# Patient Record
Sex: Male | Born: 1945
Health system: Southern US, Community
[De-identification: ages and names within clinical notes are randomized; demographics above are authoritative.]

## PROBLEM LIST (undated history)

## (undated) ENCOUNTER — Emergency Department (HOSPITAL_COMMUNITY): Discharge status: Absent without leave | Payer: Medicare Other

## (undated) DIAGNOSIS — K2971 Gastritis, unspecified, with bleeding: Secondary | ICD-10-CM

## (undated) DIAGNOSIS — E538 Deficiency of other specified B group vitamins: Secondary | ICD-10-CM

## (undated) DIAGNOSIS — M545 Low back pain, unspecified: Secondary | ICD-10-CM

## (undated) DIAGNOSIS — E785 Hyperlipidemia, unspecified: Secondary | ICD-10-CM

## (undated) DIAGNOSIS — G4700 Insomnia, unspecified: Secondary | ICD-10-CM

## (undated) DIAGNOSIS — I739 Peripheral vascular disease, unspecified: Secondary | ICD-10-CM

## (undated) DIAGNOSIS — Z87442 Personal history of urinary calculi: Secondary | ICD-10-CM

## (undated) DIAGNOSIS — Z9889 Other specified postprocedural states: Secondary | ICD-10-CM

## (undated) DIAGNOSIS — K219 Gastro-esophageal reflux disease without esophagitis: Secondary | ICD-10-CM

## (undated) DIAGNOSIS — H9190 Unspecified hearing loss, unspecified ear: Secondary | ICD-10-CM

## (undated) DIAGNOSIS — N200 Calculus of kidney: Secondary | ICD-10-CM

## (undated) DIAGNOSIS — R0600 Dyspnea, unspecified: Secondary | ICD-10-CM

## (undated) DIAGNOSIS — E559 Vitamin D deficiency, unspecified: Secondary | ICD-10-CM

## (undated) DIAGNOSIS — I10 Essential (primary) hypertension: Secondary | ICD-10-CM

## (undated) DIAGNOSIS — N529 Male erectile dysfunction, unspecified: Secondary | ICD-10-CM

## (undated) DIAGNOSIS — C801 Malignant (primary) neoplasm, unspecified: Secondary | ICD-10-CM

## (undated) DIAGNOSIS — I714 Abdominal aortic aneurysm, without rupture, unspecified: Secondary | ICD-10-CM

## (undated) DIAGNOSIS — M1990 Unspecified osteoarthritis, unspecified site: Secondary | ICD-10-CM

## (undated) DIAGNOSIS — T7840XA Allergy, unspecified, initial encounter: Secondary | ICD-10-CM

## (undated) DIAGNOSIS — R0609 Other forms of dyspnea: Secondary | ICD-10-CM

## (undated) HISTORY — DX: Allergy, unspecified, initial encounter: T78.40XA

## (undated) HISTORY — DX: Peripheral vascular disease, unspecified: I73.9

## (undated) HISTORY — DX: Vitamin D deficiency, unspecified: E55.9

## (undated) HISTORY — DX: Other specified postprocedural states: Z98.890

## (undated) HISTORY — DX: Male erectile dysfunction, unspecified: N52.9

## (undated) HISTORY — DX: Gastritis, unspecified, with bleeding: K29.71

## (undated) HISTORY — DX: Abdominal aortic aneurysm, without rupture, unspecified: I71.40

## (undated) HISTORY — DX: Hyperlipidemia, unspecified: E78.5

## (undated) HISTORY — DX: Essential (primary) hypertension: I10

## (undated) HISTORY — PX: COLONOSCOPY: SHX174

## (undated) HISTORY — DX: Unspecified osteoarthritis, unspecified site: M19.90

## (undated) HISTORY — DX: Gastro-esophageal reflux disease without esophagitis: K21.9

## (undated) HISTORY — DX: Deficiency of other specified B group vitamins: E53.8

## (undated) HISTORY — PX: KNEE SURGERY: SHX244

## (undated) HISTORY — DX: Insomnia, unspecified: G47.00

## (undated) HISTORY — DX: Calculus of kidney: N20.0

## (undated) HISTORY — DX: Abdominal aortic aneurysm, without rupture: I71.4

## (undated) HISTORY — DX: Low back pain: M54.5

## (undated) HISTORY — PX: TONSILLECTOMY: SUR1361

## (undated) HISTORY — DX: Unspecified hearing loss, unspecified ear: H91.90

## (undated) HISTORY — DX: Dyspnea, unspecified: R06.00

## (undated) HISTORY — DX: Other forms of dyspnea: R06.09

## (undated) HISTORY — DX: Low back pain, unspecified: M54.50

---

## 1999-04-17 ENCOUNTER — Encounter: Payer: Self-pay | Admitting: Internal Medicine

## 1999-04-17 ENCOUNTER — Encounter: Admission: RE | Admit: 1999-04-17 | Discharge: 1999-04-17 | Payer: Self-pay | Admitting: Internal Medicine

## 2001-10-04 ENCOUNTER — Ambulatory Visit (HOSPITAL_COMMUNITY): Admission: RE | Admit: 2001-10-04 | Discharge: 2001-10-04 | Payer: Self-pay | Admitting: Internal Medicine

## 2003-08-02 ENCOUNTER — Encounter: Admission: RE | Admit: 2003-08-02 | Discharge: 2003-08-02 | Payer: Self-pay | Admitting: Internal Medicine

## 2005-05-02 ENCOUNTER — Encounter: Admission: RE | Admit: 2005-05-02 | Discharge: 2005-05-02 | Payer: Self-pay | Admitting: Internal Medicine

## 2005-05-17 DIAGNOSIS — Z9889 Other specified postprocedural states: Secondary | ICD-10-CM

## 2005-05-17 HISTORY — DX: Other specified postprocedural states: Z98.890

## 2006-01-14 ENCOUNTER — Encounter: Admission: RE | Admit: 2006-01-14 | Discharge: 2006-01-14 | Payer: Self-pay | Admitting: Internal Medicine

## 2007-01-11 ENCOUNTER — Encounter: Admission: RE | Admit: 2007-01-11 | Discharge: 2007-01-11 | Payer: Self-pay | Admitting: Otolaryngology

## 2007-05-12 ENCOUNTER — Encounter: Admission: RE | Admit: 2007-05-12 | Discharge: 2007-05-12 | Payer: Self-pay | Admitting: Otolaryngology

## 2007-11-25 ENCOUNTER — Observation Stay (HOSPITAL_COMMUNITY): Admission: EM | Admit: 2007-11-25 | Discharge: 2007-11-26 | Payer: Self-pay | Admitting: Internal Medicine

## 2007-11-25 ENCOUNTER — Encounter: Payer: Self-pay | Admitting: Emergency Medicine

## 2008-07-25 ENCOUNTER — Ambulatory Visit (HOSPITAL_COMMUNITY): Admission: RE | Admit: 2008-07-25 | Discharge: 2008-07-25 | Payer: Self-pay | Admitting: Internal Medicine

## 2009-11-26 ENCOUNTER — Encounter: Admission: RE | Admit: 2009-11-26 | Discharge: 2009-11-26 | Payer: Self-pay | Admitting: Family Medicine

## 2010-07-28 ENCOUNTER — Other Ambulatory Visit: Payer: Self-pay | Admitting: Internal Medicine

## 2010-07-28 DIAGNOSIS — M5416 Radiculopathy, lumbar region: Secondary | ICD-10-CM

## 2010-07-30 ENCOUNTER — Ambulatory Visit
Admission: RE | Admit: 2010-07-30 | Discharge: 2010-07-30 | Disposition: A | Discharge status: Home / Self Care | Payer: 59 | Source: Ambulatory Visit | Attending: Internal Medicine | Admitting: Internal Medicine

## 2010-08-01 ENCOUNTER — Ambulatory Visit
Admission: RE | Admit: 2010-08-01 | Discharge: 2010-08-01 | Disposition: A | Discharge status: Home / Self Care | Payer: 59 | Source: Ambulatory Visit | Attending: Internal Medicine | Admitting: Internal Medicine

## 2010-08-01 DIAGNOSIS — M5416 Radiculopathy, lumbar region: Secondary | ICD-10-CM

## 2010-08-16 DIAGNOSIS — N200 Calculus of kidney: Secondary | ICD-10-CM

## 2010-08-16 HISTORY — DX: Calculus of kidney: N20.0

## 2010-09-29 NOTE — Discharge Summary (Signed)
NAME:  William Warner, William Warner             ACCOUNT NO.:  [id number]   MEDICAL RECORD NO.:  [id number]          PATIENT TYPE:  INP   LOCATION:  3740                         FACILITY:  [facility]   PHYSICIAN:  Thereasa Solo. Little, M.D. DATE OF BIRTH:  1945-11-03   DATE OF ADMISSION:  11/25/2007  DATE OF DISCHARGE:  11/26/2007                               DISCHARGE SUMMARY   DISCHARGE DIAGNOSES:  1. Chest pain, myocardial infarction ruled out.  2. Negative Myoview in 2004.  3. Daily ethanol.  4. History of smoking.  5. Dyslipidemia.  6. Treated hypertension.   HOSPITAL COURSE:  The patient is a 65 year old male who has had a  previous negative Myoview by Dr. Clarene Duke who presented on November 25, 2007  with chest pain.  He ruled out for an MI.  His EKG did have some  nonspecific changes with poor anterior R-wave progression.  The patient  was seen by Dr. Dossie Arbour.  He feels the patient can be discharged on  November 26, 2007 and follow up with Dr. Clarene Duke.  We will set him up for a  outpatient Myoview.  We did give him a prescription for nitroglycerin  and asked him to double up on his PPI for a week.   DISCHARGE MEDICATIONS:  1. Omeprazole 20 mg twice a day.  2. Atenolol 25 mg twice a day.  3. Lopid 600 mg twice a day.  4. Felodipine 5 mg a day.  5. Simvastatin 20 mg a day.  6. Benazepril 10 mg a day.  7. Aspirin 81 mg a day.  8. Nitroglycerin sublingual p.r.n.   LABORATORY DATA:  White count 6.2, hemoglobin 14.2, hematocrit 41.2,  platelets 112.  Sodium 138, potassium 3.2, BUN 5, creatinine 0.8.  CK-MB  and troponins were negative x3.  Cholesterol was 153, HDL 52, LDL 62.  TSH 3.95.  D-dimer was 1.  A CT was obtained which showed no pulmonary  embolism.  Chest x-ray with borderline cardiomegaly, no acute process.  EKG shows sinus rhythm with poor anterior R-wave progression and left  axis deviation.   DISPOSITION:  The patient was discharged in stable condition.  He will  have an outpatient  Myoview and then see Dr. Clarene Duke in followup.      Abelino Derrick, P.A.    ______________________________  Thereasa Solo. Little, M.D.    [name]  D:  11/26/2007  T:  11/26/2007  Job:  [id number]   cc:   Candyce Churn, M.D.

## 2011-02-11 LAB — CBC
HCT: 41.2
HCT: 44.5
Hemoglobin: 15.7
MCHC: 34.5
MCV: 94.7
MCV: 93.9
Platelets: 112 — ABNORMAL LOW
Platelets: 119 — ABNORMAL LOW
RBC: 4.74
RDW: 13.5
RDW: 13.1
WBC: 7.5

## 2011-02-11 LAB — DIFFERENTIAL
Eosinophils Absolute: 0.2
Eosinophils Relative: 2
Lymphocytes Relative: 23
Lymphs Abs: 1.4
Monocytes Absolute: 0.6
Monocytes Absolute: 0.5
Monocytes Relative: 9
Monocytes Relative: 7
Neutro Abs: 4
Neutrophils Relative %: 65
Neutrophils Relative %: 69

## 2011-02-11 LAB — POCT CARDIAC MARKERS: Operator id: 5507

## 2011-02-11 LAB — LIPID PANEL
LDL Cholesterol: 62
Total CHOL/HDL Ratio: 2.9
Triglycerides: 194 — ABNORMAL HIGH

## 2011-02-11 LAB — D-DIMER, QUANTITATIVE: D-Dimer, Quant: 1.04 — ABNORMAL HIGH

## 2011-02-11 LAB — TSH: TSH: 3.958

## 2011-02-11 LAB — HEPARIN LEVEL (UNFRACTIONATED): Heparin Unfractionated: 1 — ABNORMAL HIGH

## 2011-02-11 LAB — MAGNESIUM: Magnesium: 1.8

## 2011-02-11 LAB — PROTIME-INR: Prothrombin Time: 12.9

## 2011-02-11 LAB — BASIC METABOLIC PANEL
CO2: 21
CO2: 22
Calcium: 8.8
Calcium: 9.5
Creatinine, Ser: 0.9
GFR calc Af Amer: >60
Glucose, Bld: 129 — ABNORMAL HIGH
Sodium: 140

## 2011-02-11 LAB — CARDIAC PANEL(CRET KIN+CKTOT+MB+TROPI)
CK, MB: 2.1
CK, MB: 2.3
Relative Index: 2.2
Relative Index: INVALID
Troponin I: <0.01
Troponin I: <0.01

## 2011-07-13 ENCOUNTER — Other Ambulatory Visit: Payer: Self-pay | Admitting: Internal Medicine

## 2011-07-13 DIAGNOSIS — R1031 Right lower quadrant pain: Secondary | ICD-10-CM

## 2011-07-16 ENCOUNTER — Ambulatory Visit
Admission: RE | Admit: 2011-07-16 | Discharge: 2011-07-16 | Disposition: A | Discharge status: Home / Self Care | Payer: Medicare Other | Source: Ambulatory Visit | Attending: Internal Medicine | Admitting: Internal Medicine

## 2011-07-16 DIAGNOSIS — R1031 Right lower quadrant pain: Secondary | ICD-10-CM

## 2011-07-16 MED ORDER — IOHEXOL 300 MG/ML  SOLN
100.0000 mL | Freq: Once | INTRAMUSCULAR | Status: AC | PRN
  Administered 2011-07-16: 100 mL via INTRAVENOUS

## 2011-07-20 ENCOUNTER — Encounter: Payer: Self-pay | Admitting: Internal Medicine

## 2011-12-14 ENCOUNTER — Encounter (INDEPENDENT_AMBULATORY_CARE_PROVIDER_SITE_OTHER): Payer: Self-pay | Admitting: Surgery

## 2012-01-06 ENCOUNTER — Ambulatory Visit (INDEPENDENT_AMBULATORY_CARE_PROVIDER_SITE_OTHER): Payer: Medicare Other | Admitting: Surgery

## 2012-01-06 ENCOUNTER — Encounter (INDEPENDENT_AMBULATORY_CARE_PROVIDER_SITE_OTHER): Payer: Self-pay | Admitting: Surgery

## 2012-01-06 VITALS — BP 120/86 | HR 68 | Temp 98.1°F | Resp 20 | Ht 70.0 in | Wt 189.0 lb

## 2012-01-06 DIAGNOSIS — R1011 Right upper quadrant pain: Secondary | ICD-10-CM

## 2012-01-06 NOTE — Progress Notes (Signed)
Patient ID: William Warner, male   DOB: November 18, 1945, 66 y.o.   MRN: [id number]  Chief Complaint  Patient presents with  . Pre-op Exam    ing hernia    HPI William Warner is a 66 y.o. male.  Referred by Dr. Johnella Warner for right upper quadrant pain, right inguinal hernia HPI This is a 66 year old male who presents with at least 3 years of intermittent right upper quadrant abdominal pain. The patient cannot pinpoint any exacerbating event. He does not relate this pain to food. The pain does radiate around to his back. He does also have some chronic back pain with some disc disease followed by Dr. Marikay Warner. He has had workup including an abdominal ultrasound which was unremarkable. A CT scan showed no reason for his pain but did show a small right inguinal hernia as well as a hydrocele. The patient denies any pain in his right groin. He was also noted to have some mild diverticulosis of the colon but no sign of diverticulitis. He also has a small abdominal aortic aneurysm. He is now referred for evaluation of his hernia as well as his right upper quadrant abdominal pain. Past Medical History  Diagnosis Date  . Hypertension   . Hyperlipidemia   . GERD (gastroesophageal reflux disease)   . Hemorrhagic gastritis   . Insomnia   . Peripheral vascular disease   . Renal calculi   . DJD (degenerative joint disease)   . Allergy   . Hx of colonoscopy 2007  . Erectile dysfunction   . Hearing loss   . Other B-complex deficiencies   . Lumbago   AAA - 3.1 cm   PSH:  Tonsillectomy, Right knee surgery   History reviewed. No pertinent family history.  Social History History  Substance Use Topics  . Smoking status: Current Everyday Smoker -- 1.0 packs/day    Types: Cigarettes  . Smokeless tobacco: Not on file  . Alcohol Use: 0.0 oz/week    3-6 Cans of beer per week    Allergies  Allergen Reactions  . Bee Venom Swelling    Current Outpatient Prescriptions  Medication Sig  Dispense Refill  . aspirin 81 MG tablet Take 81 mg by mouth daily.      William Warner atenolol (TENORMIN) 25 MG tablet Take 25 mg by mouth daily.      . clonazePAM (KLONOPIN) 2 MG tablet Take 2 mg by mouth 2 (two) times daily as needed.      William Warner EPINEPHrine (EPIPEN JR) 0.15 MG/0.3ML injection Inject 0.15 mg into the muscle as needed.      William Warner esomeprazole (NEXIUM) 40 MG capsule Take 40 mg by mouth daily before breakfast.      . felodipine (PLENDIL) 5 MG 24 hr tablet Take 5 mg by mouth daily.      William Warner gemfibrozil (LOPID) 600 MG tablet Take 600 mg by mouth 2 (two) times daily before a meal.      . simvastatin (ZOCOR) 20 MG tablet Take 20 mg by mouth every evening.      . traMADol (ULTRAM) 50 MG tablet Take 50 mg by mouth every 6 (six) hours as needed.        Review of Systems Review of Systems  Constitutional: Negative for fever, chills and unexpected weight change.  HENT: Negative for hearing loss, congestion, sore throat, trouble swallowing and voice change.   Eyes: Negative for visual disturbance.  Respiratory: Negative for cough and wheezing.   Cardiovascular: Negative for chest  pain, palpitations and leg swelling.  Gastrointestinal: Positive for abdominal pain and diarrhea. Negative for nausea, vomiting, constipation, blood in stool, abdominal distention, anal bleeding and rectal pain.  Genitourinary: Negative for hematuria and difficulty urinating.  Musculoskeletal: Positive for back pain and arthralgias.  Skin: Negative for rash and wound.  Neurological: Negative for seizures, syncope, weakness and headaches.  Hematological: Negative for adenopathy. Does not bruise/bleed easily.  Psychiatric/Behavioral: Negative for confusion.    Blood pressure 120/86, pulse 68, temperature 98.1 F (36.7 C), temperature source Temporal, resp. rate 20, height 5\' 10"  (1.778 m), weight 189 lb (85.73 kg).  Physical Exam Physical Exam WDWN in NAD HEENT:  EOMI, sclera anicteric Neck:  No masses, no  thyromegaly Lungs:  CTA bilaterally; normal respiratory effort CV:  Regular rate and rhythm; no murmurs Abd:  +bowel sounds, soft, non-tender, no masses GU:  No palpable hernia on either side; no tenderness; bilateral descended testicles - no masses Ext:  Well-perfused; no edema Skin:  Warm, dry; no sign of jaundice  Data Reviewed  07/28/10 *RADIOLOGY REPORT*  Clinical Data: Abdominal pain.  COMPLETE ABDOMINAL ULTRASOUND  Comparison: CT scan 11/26/2009 and abdominal ultrasound  07/25/2008.  Findings:  Gallbladder: No gallstones, gallbladder wall thickening, or  pericholecystic fluid.  Common bile duct: Normal in caliber measuring a maximum of 3.2 mm.  Liver: Diffuse increased echogenicity with poor through  transmission and poor definition of the liver architecture  suggesting fatty infiltration. No focal hepatic lesions or  intrahepatic ductal dilatation.  IVC: Normal caliber.  Pancreas: Limited visualization due to overlying bowel gas.  Spleen: Normal in size and echogenicity without focal lesions.  Right Kidney: 10.6 cm in length. Normal renal cortical thickness  and echogenicity without focal lesions or hydronephrosis.A 6 mm  lower pole calculus is suspected.  Left Kidney: 12.4 cm in length. Normal renal cortical thickness  and echogenicity without focal lesions or hydronephrosis.A 6 mm  lower pole renal calculus is suspected.  Abdominal aorta: Atherosclerotic changes. Small focal  infrarenal aneurysm with maximal AP diameter of 2.4 cm, maximal  transverse diameter of 2.7 cm and length of 2.9 cm.  IMPRESSION:  1. Normal sonographic appearance of the gallbladder and normal  caliber common bile duct.  2. Diffuse fatty infiltration of the liver.  3. Limited visualization of the pancreas.  4. Bilateral lower pole renal calculi.  5. Small focal infrarenal abdominal aortic aneurysm.  Original Report Authenticated By: William Warner, M.D.  07/13/11 *RADIOLOGY REPORT*   Clinical Data: Right lower quadrant abdominal pain.  CT ABDOMEN AND PELVIS WITH CONTRAST  Technique: Multidetector CT imaging of the abdomen and pelvis was  performed following the standard protocol during bolus  administration of intravenous contrast.  Contrast: OMNIPAQUE IOHEXOL 300 MG/ML IJ SOLN  Comparison: CT scan 11/26/2009.  Findings: The examination is somewhat limited by patient motion.  The visualized lung bases are clear.  The liver is unremarkable. No focal lesions or biliary dilatation.  The gallbladder is normal. No common bile duct dilatation. The  pancreas is normal. The spleen is normal in size. No focal  lesions. The adrenal glands and kidneys are grossly normal. A  small interpolar right renal calculus is noted. No obstructing  ureteral calculi or bladder calculi.  The stomach, duodenum, small bowel and colon are unremarkable  except for moderate diverticulosis of the sigmoid colon. No  findings for acute diverticulitis. The appendix is normal.  There are advanced atherosclerotic changes involving the aorta but  no aneurysm. Stable small  focal chronic dissection distally versus  ulcerated plaque. No mesenteric or retroperitoneal mass or  adenopathy.  The bladder, prostate gland and seminal vesicles are unremarkable.  No inguinal mass or adenopathy. A right-sided inguinal hernia and  hydrocele is noted.  The bony structures are intact. There are bilateral pars defects  noted at L5 with a grade 1 spondylolisthesis. Moderate  degenerative changes in the lumbar spine. A hemangioma is noted in  the L3 vertebral body.  IMPRESSION:  1. No acute abdominal/pelvic findings, mass lesions or adenopathy.  2. Advanced atherosclerotic changes involving the aorta and iliac  vessels.  3. Diverticulosis of the sigmoid colon but no findings for acute  diverticulitis.  4. Right inguinal hernia and hydrocele.  Original Report Authenticated By: William Warner,  M.D.    Assessment    RUQ pain - intermittent, unclear etiology; negative ultrasound; no other findings on CT scan Right inguinal hernia - asymptomatic    Plan    HIDA scan to rule out biliary dyskinesia Will watch the asymptomatic RIH for now.  If it enlarges or becomes asymptomatic, we will consider elective repair. Follow-up after HIDA scan       Darius Lundberg K. 01/06/2012, 5:37 PM

## 2012-01-12 ENCOUNTER — Encounter (HOSPITAL_COMMUNITY)
Admission: RE | Admit: 2012-01-12 | Discharge: 2012-01-12 | Disposition: A | Discharge status: Home / Self Care | Payer: Medicare Other | Source: Ambulatory Visit | Attending: Surgery | Admitting: Surgery

## 2012-01-12 DIAGNOSIS — R1011 Right upper quadrant pain: Secondary | ICD-10-CM | POA: Insufficient documentation

## 2012-01-12 MED ORDER — TECHNETIUM TC 99M MEBROFENIN IV KIT
5.0000 | PACK | Freq: Once | INTRAVENOUS | Status: AC | PRN
  Administered 2012-01-12: 5 via INTRAVENOUS

## 2012-01-27 ENCOUNTER — Encounter (INDEPENDENT_AMBULATORY_CARE_PROVIDER_SITE_OTHER): Payer: Self-pay | Admitting: Surgery

## 2012-01-27 ENCOUNTER — Ambulatory Visit (INDEPENDENT_AMBULATORY_CARE_PROVIDER_SITE_OTHER): Payer: Medicare Other | Admitting: Surgery

## 2012-01-27 VITALS — BP 124/76 | HR 71 | Temp 98.0°F | Resp 16 | Ht 70.0 in | Wt 193.2 lb

## 2012-01-27 DIAGNOSIS — R1011 Right upper quadrant pain: Secondary | ICD-10-CM

## 2012-01-27 NOTE — Patient Instructions (Signed)
Call my nurse Pattricia Boss at [phone] to schedule a follow-up appointment if needed.

## 2012-01-27 NOTE — Progress Notes (Signed)
The patient continues to have some intermittent right-sided abdominal pain his high the skin was negative with a gallbladder ejection fraction over 76%. On examination I cannot palpate any abdominal masses. I cannot palpate the small right inguinal hernia that was seen on the CT scan. He has a annual physical exam with Dr. Gayleen Orem next week. I would recommend considering referral for a colonoscopy. His last colonoscopy was about 8 years ago. If the colonoscopy is negative then it is likely that this chronic low-grade pain is from a spinal etiology. I gave him our number to call for a PRN followup visit.  Filed Vitals:   01/27/12 1434  BP: 124/76  Pulse: 71  Temp: 98 F (36.7 C)  Resp: 16    Rhiann Boucher K. Corliss Skains, MD, [facility]  01/27/2012 3:03 PM

## 2013-03-27 ENCOUNTER — Other Ambulatory Visit: Payer: Self-pay | Admitting: Internal Medicine

## 2013-03-27 DIAGNOSIS — R109 Unspecified abdominal pain: Secondary | ICD-10-CM

## 2013-03-29 ENCOUNTER — Ambulatory Visit
Admission: RE | Admit: 2013-03-29 | Discharge: 2013-03-29 | Disposition: A | Discharge status: Home / Self Care | Payer: Medicare Other | Source: Ambulatory Visit | Attending: Internal Medicine | Admitting: Internal Medicine

## 2013-03-29 DIAGNOSIS — R109 Unspecified abdominal pain: Secondary | ICD-10-CM

## 2013-03-29 MED ORDER — IOHEXOL 300 MG/ML  SOLN
100.0000 mL | Freq: Once | INTRAMUSCULAR | Status: AC | PRN
  Administered 2013-03-29: 100 mL via INTRAVENOUS

## 2014-03-18 ENCOUNTER — Other Ambulatory Visit: Payer: Self-pay | Admitting: Internal Medicine

## 2014-03-18 ENCOUNTER — Ambulatory Visit
Admission: RE | Admit: 2014-03-18 | Discharge: 2014-03-18 | Disposition: A | Discharge status: Home / Self Care | Payer: Medicare Other | Source: Ambulatory Visit | Attending: Internal Medicine | Admitting: Internal Medicine

## 2014-03-18 DIAGNOSIS — R634 Abnormal weight loss: Secondary | ICD-10-CM

## 2014-06-25 ENCOUNTER — Emergency Department (HOSPITAL_BASED_OUTPATIENT_CLINIC_OR_DEPARTMENT_OTHER): Payer: PPO

## 2014-06-25 ENCOUNTER — Emergency Department (HOSPITAL_BASED_OUTPATIENT_CLINIC_OR_DEPARTMENT_OTHER)
Admission: EM | Admit: 2014-06-25 | Discharge: 2014-06-25 | Disposition: A | Discharge status: Home / Self Care | Payer: PPO | Attending: Emergency Medicine | Admitting: Emergency Medicine

## 2014-06-25 ENCOUNTER — Encounter (HOSPITAL_BASED_OUTPATIENT_CLINIC_OR_DEPARTMENT_OTHER): Payer: Self-pay

## 2014-06-25 DIAGNOSIS — K219 Gastro-esophageal reflux disease without esophagitis: Secondary | ICD-10-CM | POA: Diagnosis not present

## 2014-06-25 DIAGNOSIS — I10 Essential (primary) hypertension: Secondary | ICD-10-CM | POA: Insufficient documentation

## 2014-06-25 DIAGNOSIS — Z79899 Other long term (current) drug therapy: Secondary | ICD-10-CM | POA: Insufficient documentation

## 2014-06-25 DIAGNOSIS — Z87442 Personal history of urinary calculi: Secondary | ICD-10-CM | POA: Diagnosis not present

## 2014-06-25 DIAGNOSIS — N201 Calculus of ureter: Secondary | ICD-10-CM | POA: Diagnosis not present

## 2014-06-25 DIAGNOSIS — E785 Hyperlipidemia, unspecified: Secondary | ICD-10-CM | POA: Insufficient documentation

## 2014-06-25 DIAGNOSIS — Z720 Tobacco use: Secondary | ICD-10-CM | POA: Diagnosis not present

## 2014-06-25 DIAGNOSIS — Z7982 Long term (current) use of aspirin: Secondary | ICD-10-CM | POA: Diagnosis not present

## 2014-06-25 DIAGNOSIS — H9190 Unspecified hearing loss, unspecified ear: Secondary | ICD-10-CM | POA: Diagnosis not present

## 2014-06-25 DIAGNOSIS — R109 Unspecified abdominal pain: Secondary | ICD-10-CM

## 2014-06-25 DIAGNOSIS — R1031 Right lower quadrant pain: Secondary | ICD-10-CM | POA: Diagnosis present

## 2014-06-25 DIAGNOSIS — N23 Unspecified renal colic: Secondary | ICD-10-CM

## 2014-06-25 LAB — CBC
HCT: 42.9 % (ref 39.0–52.0)
Hemoglobin: 14.3 g/dL (ref 13.0–17.0)
MCH: 31.2 pg (ref 26.0–34.0)
MCHC: 33.3 g/dL (ref 30.0–36.0)
MCV: 93.5 fL (ref 78.0–100.0)
Platelets: 171 10*3/uL (ref 150–400)
RBC: 4.59 MIL/uL (ref 4.22–5.81)
RDW: 12.8 % (ref 11.5–15.5)
WBC: 10.7 10*3/uL — ABNORMAL HIGH (ref 4.0–10.5)

## 2014-06-25 LAB — BASIC METABOLIC PANEL
Anion gap: 5 (ref 5–15)
BUN: 13 mg/dL (ref 6–23)
CO2: 26 mmol/L (ref 19–32)
CREATININE: 0.88 mg/dL (ref 0.50–1.35)
Calcium: 8.7 mg/dL (ref 8.4–10.5)
Chloride: 104 mmol/L (ref 96–112)
GFR calc Af Amer: >90 mL/min (ref >90)
GFR calc non Af Amer: 86 mL/min — ABNORMAL LOW (ref >90)
GLUCOSE: 112 mg/dL — AB (ref 70–99)
Potassium: 3.5 mmol/L (ref 3.5–5.1)
Sodium: 135 mmol/L (ref 135–145)

## 2014-06-25 LAB — URINALYSIS, ROUTINE W REFLEX MICROSCOPIC
Bilirubin Urine: NEGATIVE
GLUCOSE, UA: NEGATIVE mg/dL
Ketones, ur: NEGATIVE mg/dL
Nitrite: NEGATIVE
PH: 5.5 (ref 5.0–8.0)
PROTEIN: 30 mg/dL — AB
Specific Gravity, Urine: 1.023 (ref 1.005–1.030)
UROBILINOGEN UA: 1 mg/dL (ref 0.0–1.0)

## 2014-06-25 LAB — URINE MICROSCOPIC-ADD ON

## 2014-06-25 MED ORDER — HYDROMORPHONE HCL 1 MG/ML IJ SOLN
1.0000 mg | Freq: Once | INTRAMUSCULAR | Status: AC
  Administered 2014-06-25: 1 mg via INTRAVENOUS
  Filled 2014-06-25: qty 1

## 2014-06-25 MED ORDER — TAMSULOSIN HCL 0.4 MG PO CAPS: 0.4000 mg | ORAL_CAPSULE | Freq: Every day | ORAL | Status: DC

## 2014-06-25 MED ORDER — ONDANSETRON HCL 4 MG PO TABS: 4.0000 mg | ORAL_TABLET | Freq: Four times a day (QID) | ORAL | Status: DC

## 2014-06-25 MED ORDER — OXYCODONE-ACETAMINOPHEN 5-325 MG PO TABS
1.0000 | ORAL_TABLET | Freq: Four times a day (QID) | ORAL | Status: DC | PRN
Start: 2014-06-25 — End: 2017-09-17

## 2014-06-25 MED ORDER — ONDANSETRON HCL 4 MG/2ML IJ SOLN
4.0000 mg | Freq: Once | INTRAMUSCULAR | Status: AC
  Administered 2014-06-25: 4 mg via INTRAVENOUS
  Filled 2014-06-25: qty 2

## 2014-06-25 NOTE — ED Provider Notes (Signed)
CSN: [id number]     Arrival date & time 06/25/14  0410 History   First MD Initiated Contact with Patient 06/25/14 0421     Chief Complaint  Patient presents with  . Flank Pain     (Consider location/radiation/quality/duration/timing/severity/associated sxs/prior Treatment) HPI  Pt presenting with c/o right flank pain. Pt he has pain in right lower abdomen as well.  Has hx of kidney stones and states this feels similar.  No fever. He had some pain 1 day ago associated with vomiting which resolved until earlier tonight when pain recurred.  .  No changes in stools.  No dysuria or blood in urine.  Pain began 4 hours prior to arrival and has been constant.   He has not had any treatment prior to arrival.  There are no other associated systemic symptoms, there are no other alleviating or modifying factors.   Past Medical History  Diagnosis Date  . Hypertension   . Hyperlipidemia   . GERD (gastroesophageal reflux disease)   . Hemorrhagic gastritis   . Insomnia   . Peripheral vascular disease   . Renal calculi   . DJD (degenerative joint disease)   . Allergy   . Hx of colonoscopy 2007  . Erectile dysfunction   . Hearing loss   . Other B-complex deficiencies   . Lumbago    Past Surgical History  Procedure Laterality Date  . Tonsillectomy     No family history on file. History  Substance Use Topics  . Smoking status: Current Every Day Smoker -- 1.00 packs/day    Types: Cigarettes  . Smokeless tobacco: Not on file  . Alcohol Use: No    Review of Systems  ROS reviewed and all otherwise negative except for mentioned in HPI    Allergies  Bee venom  Home Medications   Prior to Admission medications   Medication Sig Start Date End Date Taking? Authorizing Provider  aspirin 81 MG tablet Take 81 mg by mouth daily.    Historical Provider, MD  atenolol (TENORMIN) 25 MG tablet Take 25 mg by mouth daily.    Historical Provider, MD  clonazePAM (KLONOPIN) 2 MG tablet Take 2 mg by  mouth 2 (two) times daily as needed.    Historical Provider, MD  EPINEPHrine (EPIPEN JR) 0.15 MG/0.3ML injection Inject 0.15 mg into the muscle as needed.    Historical Provider, MD  esomeprazole (NEXIUM) 40 MG capsule Take 40 mg by mouth daily before breakfast.    Historical Provider, MD  felodipine (PLENDIL) 5 MG 24 hr tablet Take 5 mg by mouth daily.    Historical Provider, MD  gemfibrozil (LOPID) 600 MG tablet Take 600 mg by mouth 2 (two) times daily before a meal.    Historical Provider, MD  ondansetron (ZOFRAN) 4 MG tablet Take 1 tablet (4 mg total) by mouth every 6 (six) hours. 06/25/14   Threasa Beards, MD  oxyCODONE-acetaminophen (PERCOCET/ROXICET) 5-325 MG per tablet Take 1-2 tablets by mouth every 6 (six) hours as needed for severe pain. 06/25/14   Threasa Beards, MD  simvastatin (ZOCOR) 20 MG tablet Take 20 mg by mouth every evening.    Historical Provider, MD  tamsulosin (FLOMAX) 0.4 MG CAPS capsule Take 1 capsule (0.4 mg total) by mouth daily. 06/25/14   Threasa Beards, MD  traMADol (ULTRAM) 50 MG tablet Take 50 mg by mouth every 6 (six) hours as needed.    Historical Provider, MD   BP 119/55 mmHg  Pulse 62  Temp(Src) 98.6 F (37 C) (Oral)  Resp 18  Ht 5\' 10"  (1.778 m)  Wt 165 lb (74.844 kg)  BMI 23.68 kg/m2  SpO2 98%  Vitals reviewed Physical Exam  Physical Examination: General appearance - alert, well appearing, and in no distress Mental status - alert, oriented to person, place, and time Eyes - no conjunctival injection, no scleral icterus Mouth - mucous membranes moist, pharynx normal without lesions Chest - clear to auscultation, no wheezes, rales or rhonchi, symmetric air entry Heart - normal rate, regular rhythm, normal S1, S2, no murmurs, rubs, clicks or gallops Abdomen - soft, nontender, nondistended, no masses or organomegaly, nabs Back exam - no midline tenderness to palpation, mild right CVA tenderness Extremities - peripheral pulses normal, no pedal edema, no  clubbing or cyanosis Skin - normal coloration and turgor, no rashes  ED Course  Procedures (including critical care time)  6:10 AM on recheck pain is down from 9 to 3.  CT scan shows proximal ureteral stone.  Pt requests another dose of pain medication.   Labs Review Labs Reviewed  CBC - Abnormal; Notable for the following:    WBC 10.7 (*)    All other components within normal limits  BASIC METABOLIC PANEL - Abnormal; Notable for the following:    Glucose, Bld 112 (*)    GFR calc non Af Amer 86 (*)    All other components within normal limits  URINALYSIS, ROUTINE W REFLEX MICROSCOPIC - Abnormal; Notable for the following:    APPearance CLOUDY (*)    Hgb urine dipstick LARGE (*)    Protein, ur 30 (*)    Leukocytes, UA TRACE (*)    All other components within normal limits  URINE MICROSCOPIC-ADD ON - Abnormal; Notable for the following:    Squamous Epithelial / LPF FEW (*)    All other components within normal limits  URINE CULTURE    Imaging Review Ct Renal Stone Study  06/25/2014   CLINICAL DATA:  Acute onset of right flank pain for 4 hours. Nausea and vomiting. Hematuria. Initial encounter.  EXAM: CT ABDOMEN AND PELVIS WITHOUT CONTRAST  TECHNIQUE: Multidetector CT imaging of the abdomen and pelvis was performed following the standard protocol without IV contrast.  COMPARISON:  CT of the abdomen and pelvis performed 07/16/2011  FINDINGS: Minimal left basilar atelectasis or scarring is noted. Trace pericardial fluid remains within normal limits.  The liver and spleen are unremarkable in appearance. The gallbladder is within normal limits. The pancreas and adrenal glands are unremarkable. A few calcified nodes are noted about the pancreatic head.  There is mild right-sided hydronephrosis, with right-sided perinephric stranding and fluid, and an obstructing 5 x 4 mm stone in the proximal right ureter, 5 cm below the right renal pelvis. Scattered small bilateral renal stones are noted,  measuring up to 4 mm in size. Minimal nonspecific left-sided perinephric stranding is seen.  No free fluid is identified. The small bowel is unremarkable in appearance. The stomach is within normal limits. Diffuse calcification is seen along the abdominal aorta and its branches. There is aneurysmal dilatation of the infrarenal abdominal aorta to 3.0 cm in AP dimension and 3.2 cm in transverse dimension. Diffuse vascular calcification extends distally into both lower extremities, with concern for underlying luminal narrowing.  The appendix is normal in caliber and contains air, without evidence for appendicitis. Scattered diverticulosis is noted along the proximal sigmoid colon, without evidence of diverticulitis.  The bladder is mildly distended and grossly unremarkable. The  prostate is normal in size, with scattered calcification. No inguinal lymphadenopathy is seen.  No acute osseous abnormalities are identified. Chronic bilateral pars defects are seen at L5, with grade 1 anterolisthesis of L5 on S1, and underlying degenerative change. Endplate sclerotic change is noted at L2-L3, with associated disc space narrowing. There is mild grade 1 retrolisthesis of L3 on L4.  IMPRESSION: 1. Mild right-sided hydronephrosis, with an obstructing 5 x 4 mm stone in the proximal right ureter, 5 cm below the right renal pelvis. 2. Aneurysmal dilatation of the infrarenal abdominal aorta to 3.0 cm in AP dimension and 3.2 cm in transverse dimension. This is mildly increased from 2014. Recommend followup by ultrasound in 3 years. This recommendation follows ACR consensus guidelines: White Paper of the ACR Incidental Findings Committee II on Vascular Findings. J Am Coll Radiol 2013; 09:407-680 3. Diffuse calcification along the abdominal aorta and its branches. Diffuse vascular calcification extends distally into both lower extremities, with suspected underlying luminal narrowing. 4. Scattered diverticulosis along the proximal sigmoid  colon, without evidence of diverticulitis. 5. Scattered small nonobstructing bilateral renal stones, measuring up to 4 mm in size. 6. Endplate sclerotic change at L2-L3; chronic bilateral pars defects at L5, with grade 1 anterolisthesis of L5 on S1, and underlying degenerative change.   Electronically Signed   By: Garald Balding M.D.   On: 06/25/2014 06:03     EKG Interpretation None      MDM   Final diagnoses:  Right ureteral stone  Ureteral colic    Pt with right flank pain, RBCs in urine.  Renal function is normal.  He has proximal ureteral stone on right.  Pain under control after analgesia in the ED.  Urine culture sent.  Pt advised to f/u with urology.  Findings of aortic dilatation relayed to patient as well as need for follwup ultrasound in 3 years.  Discharged with strict return precautions.  Pt agreeable with plan.  Pt presenting  Threasa Beards, MD 06/25/14 [phone]

## 2014-06-25 NOTE — Discharge Instructions (Signed)
Return to the ED with any concerns including fever/chills, vomiting and not able to keep down liquids, pain not controlled by pain medication, difficulty urinating, decreased level of alertness/lethargy, or any other alarming symptoms   There was an incidental finding on the CT scan- the abdominal aorta should be followed up with another ultrasound to check its size in 3 years- see below, per radiology  Aneurysmal dilatation of the infrarenal abdominal aorta to 3.0 cm in AP dimension and 3.2 cm in transverse dimension. This is mildly increased from 2014. Recommend followup by ultrasound in 3 years. This recommendation follows ACR consensus guidelines: White Paper of the ACR Incidental Findings Committee II on Vascular Findings. Natasha Mead Coll Radiol 2013; [phone]

## 2014-06-25 NOTE — ED Notes (Signed)
Pt c/o rt flank pain radiating down to rt groin area x4hrs, states had vomiting and diarrhea on Sunday; pt c/o hx of kidney stones and feels the same

## 2014-06-27 LAB — URINE CULTURE

## 2014-06-28 ENCOUNTER — Telehealth (HOSPITAL_COMMUNITY): Payer: Self-pay

## 2014-06-28 NOTE — Telephone Encounter (Signed)
Post ED Visit - Positive Culture Follow-up  Culture report reviewed by antimicrobial stewardship pharmacist: []  Wes [place], Pharm.D., BCPS []  Heide Guile, Pharm.D., BCPS [x]  Alycia Rossetti, Pharm.D., BCPS []  [place], Pharm.D., BCPS, AAHIVP []  Legrand Como, Pharm.D., BCPS, AAHIVP []  Isac Sarna, Pharm.D., BCPS  Positive Urine culture, 75,000 colonies -> E Coli Chart reviewed by K. [place] PA, F/U as scheduled.  No further patient follow-up is required at this time.  Dortha Kern 06/28/2014, 11:20 AM

## 2014-06-28 NOTE — Progress Notes (Signed)
ED Antimicrobial Stewardship Positive Culture Follow Up   William Warner is an 69 y.o. male who presented to [facility] on 06/25/2014 with a chief complaint of flank pain  Chief Complaint  Patient presents with  . Flank Pain    Recent Results (from the past 720 hour(s))  Urine culture     Status: None   Collection Time: 06/25/14  4:46 AM  Result Value Ref Range Status   Specimen Description URINE, CATHETERIZED  Final   Special Requests NONE  Final   Colony Count   Final    75,000 COLONIES/ML Performed at [facility]    Culture   Final    ESCHERICHIA COLI Performed at [facility]    Report Status 06/27/2014 FINAL  Final   Organism ID, Bacteria ESCHERICHIA COLI  Final      Susceptibility   Escherichia coli - MIC*    AMPICILLIN <=2 SENSITIVE Sensitive     CEFAZOLIN <=4 SENSITIVE Sensitive     CEFTRIAXONE <=1 SENSITIVE Sensitive     CIPROFLOXACIN <=0.25 SENSITIVE Sensitive     GENTAMICIN <=1 SENSITIVE Sensitive     LEVOFLOXACIN <=0.12 SENSITIVE Sensitive     NITROFURANTOIN <=16 SENSITIVE Sensitive     TOBRAMYCIN <=1 SENSITIVE Sensitive     TRIMETH/SULFA <=20 SENSITIVE Sensitive     PIP/TAZO <=4 SENSITIVE Sensitive     * ESCHERICHIA COLI    [x]  No treatment needed  78 YOM who presented with flank pain similar to prior kidney stones. Imaging revealed a R-proximal ureteral stone. UA was unimpressive of infection, the patient was afebrile, WBC wnl. Only 75k of E.coli grew out in urine culture. Discussed with PA, would not recommend treating for now, patient to follow-up with urology.  New antibiotic prescription: No treatment needed  ED Provider: Quincy Carnes, PA-C  Lawson Radar 06/28/2014, 9:35 AM Infectious Diseases Pharmacist Phone# [phone]

## 2015-07-08 DIAGNOSIS — D81818 Other biotin-dependent carboxylase deficiency: Secondary | ICD-10-CM | POA: Diagnosis not present

## 2015-07-08 DIAGNOSIS — Z1389 Encounter for screening for other disorder: Secondary | ICD-10-CM | POA: Diagnosis not present

## 2015-07-08 DIAGNOSIS — E782 Mixed hyperlipidemia: Secondary | ICD-10-CM | POA: Diagnosis not present

## 2015-07-08 DIAGNOSIS — Z720 Tobacco use: Secondary | ICD-10-CM | POA: Diagnosis not present

## 2015-07-08 DIAGNOSIS — Z0001 Encounter for general adult medical examination with abnormal findings: Secondary | ICD-10-CM | POA: Diagnosis not present

## 2015-07-08 DIAGNOSIS — M7582 Other shoulder lesions, left shoulder: Secondary | ICD-10-CM | POA: Diagnosis not present

## 2015-07-08 DIAGNOSIS — Z23 Encounter for immunization: Secondary | ICD-10-CM | POA: Diagnosis not present

## 2015-07-08 DIAGNOSIS — I10 Essential (primary) hypertension: Secondary | ICD-10-CM | POA: Diagnosis not present

## 2015-07-08 DIAGNOSIS — N3943 Post-void dribbling: Secondary | ICD-10-CM | POA: Diagnosis not present

## 2015-07-08 DIAGNOSIS — Z125 Encounter for screening for malignant neoplasm of prostate: Secondary | ICD-10-CM | POA: Diagnosis not present

## 2015-07-08 DIAGNOSIS — G4700 Insomnia, unspecified: Secondary | ICD-10-CM | POA: Diagnosis not present

## 2015-07-08 DIAGNOSIS — Z79899 Other long term (current) drug therapy: Secondary | ICD-10-CM | POA: Diagnosis not present

## 2015-07-08 DIAGNOSIS — R634 Abnormal weight loss: Secondary | ICD-10-CM | POA: Diagnosis not present

## 2015-08-06 ENCOUNTER — Telehealth: Payer: Self-pay | Admitting: Acute Care

## 2015-08-06 DIAGNOSIS — F17210 Nicotine dependence, cigarettes, uncomplicated: Principal | ICD-10-CM

## 2015-08-07 NOTE — Telephone Encounter (Signed)
LMOMTCB x 1 

## 2015-08-07 NOTE — Telephone Encounter (Signed)
Called spoke with pt. Discussed lung cancer screening program/questionnaire. Pt does qualify for program. Appt scheduled for William Warner 08/12/15 and CT ordered.

## 2015-08-12 ENCOUNTER — Ambulatory Visit (INDEPENDENT_AMBULATORY_CARE_PROVIDER_SITE_OTHER): Payer: PPO | Admitting: Acute Care

## 2015-08-12 ENCOUNTER — Encounter: Payer: Self-pay | Admitting: Acute Care

## 2015-08-12 ENCOUNTER — Ambulatory Visit (INDEPENDENT_AMBULATORY_CARE_PROVIDER_SITE_OTHER)
Admission: RE | Admit: 2015-08-12 | Discharge: 2015-08-12 | Disposition: A | Discharge status: Home / Self Care | Payer: PPO | Source: Ambulatory Visit | Attending: Acute Care | Admitting: Acute Care

## 2015-08-12 DIAGNOSIS — Z87891 Personal history of nicotine dependence: Secondary | ICD-10-CM | POA: Diagnosis not present

## 2015-08-12 DIAGNOSIS — F17210 Nicotine dependence, cigarettes, uncomplicated: Secondary | ICD-10-CM | POA: Diagnosis not present

## 2015-08-12 NOTE — Progress Notes (Signed)
Shared Decision Making Visit Lung Cancer Screening Program [phone])   Eligibility:  Age 70 y.o.  Pack Years Smoking History Calculation 54-pack-year smoking history (# packs/per year x # years smoked)  Recent History of coughing up blood  no  Unexplained weight loss? no ( >Than 15 pounds within the last 6 months )  Prior History Lung / other cancer no (Diagnosis within the last 5 years already requiring surveillance chest CT Scans).  Smoking Status Current Smoker  Former Smokers: Years since quit: Not applicable current smoker  Quit Date: Not applicable current smoker  Visit Components:  Discussion included one or more decision making aids. yes  Discussion included risk/benefits of screening. yes  Discussion included potential follow up diagnostic testing for abnormal scans. yes  Discussion included meaning and risk of over diagnosis. yes  Discussion included meaning and risk of False Positives. yes  Discussion included meaning of total radiation exposure. yes  Counseling Included:  Importance of adherence to annual lung cancer LDCT screening. yes  Impact of comorbidities on ability to participate in the program. yes  Ability and willingness to under diagnostic treatment. yes  Smoking Cessation Counseling:  Current Smokers:   Discussed importance of smoking cessation. yes  Information about tobacco cessation classes and interventions provided to patient. yes  Patient provided with "ticket" for LDCT Scan. yes  Symptomatic Patient. no  Counseling applicable asymptomatic smoker  Diagnosis Code: Tobacco Use Z72.0  Asymptomatic Patient yes  Counseling (Intermediate counseling: > three minutes counseling) [id number]  Former Smokers:   Discussed the importance of maintaining cigarette abstinence. Not applicable current smoker  Diagnosis Code: Personal History of Nicotine Dependence. [id number]  Information about tobacco cessation classes and interventions  provided to patient. Yes  Patient provided with "ticket" for LDCT Scan. yes  Written Order for Lung Cancer Screening with LDCT placed in Epic. Yes (CT Chest Lung Cancer Screening Low Dose W/O CM) [id number] Z12.2-Screening of respiratory organs Z87.891-Personal history of nicotine dependence  I have spent 20 minutes of face to face time with Mr. Persing discussing the risks and benefits of lung cancer screening. We viewed a power point together that explained in detail the above noted topics. We paused at intervals to allow for questions to be asked and answered to ensure understanding.We discussed that the single most powerful action that he can take to decrease his risk of developing lung cancer is to quit smoking. We discussed whether or not he is ready to commit to setting a quit date. He is currently not ready to set a quit date. We discussed options for tools to aid in quitting smoking including nicotine replacement therapy, non-nicotine medications, support groups, Quit Smart classes, and behavior modification. We discussed that often times setting smaller, more achievable goals, such as eliminating 1 cigarette a day for a week and then 2 cigarettes a day for a week can be helpful in slowly decreasing the number of cigarettes smoked. This allows for a sense of accomplishment as well as providing a clinical benefit. I gave Mr. Lupold the " Be Stronger Than Your Excuses" card with contact information for community resources, classes, free nicotine replacement therapy, and access to mobile apps, text messaging, and on-line smoking cessation help. I have also given Mr. Fennewald my card and contact information in the event he needs to contact me. We discussed the time and location of the scan, and that either June Leap, CMA, or I will call with the results within 24-48 hours of receiving them. I have  provided Mr. Copland with a copy of the power point we viewed  as a resource in the event they need  reinforcement of the concepts we discussed today in the office. The patient verbalized understanding of all of  the above and had no further questions upon leaving the office. They have my contact information in the event they have any further questions.   Magdalen Spatz, NP 08/12/2015

## 2015-08-14 DIAGNOSIS — Z85828 Personal history of other malignant neoplasm of skin: Secondary | ICD-10-CM | POA: Diagnosis not present

## 2015-08-14 DIAGNOSIS — L812 Freckles: Secondary | ICD-10-CM | POA: Diagnosis not present

## 2015-08-14 DIAGNOSIS — L821 Other seborrheic keratosis: Secondary | ICD-10-CM | POA: Diagnosis not present

## 2015-08-16 ENCOUNTER — Other Ambulatory Visit: Payer: Self-pay | Admitting: Acute Care

## 2015-08-16 DIAGNOSIS — F17210 Nicotine dependence, cigarettes, uncomplicated: Principal | ICD-10-CM

## 2015-10-06 DIAGNOSIS — H524 Presbyopia: Secondary | ICD-10-CM | POA: Diagnosis not present

## 2015-10-06 DIAGNOSIS — H5203 Hypermetropia, bilateral: Secondary | ICD-10-CM | POA: Diagnosis not present

## 2015-10-06 DIAGNOSIS — H2513 Age-related nuclear cataract, bilateral: Secondary | ICD-10-CM | POA: Diagnosis not present

## 2016-02-25 DIAGNOSIS — N201 Calculus of ureter: Secondary | ICD-10-CM | POA: Diagnosis not present

## 2016-02-25 DIAGNOSIS — N132 Hydronephrosis with renal and ureteral calculous obstruction: Secondary | ICD-10-CM | POA: Diagnosis not present

## 2016-05-17 DIAGNOSIS — J0100 Acute maxillary sinusitis, unspecified: Secondary | ICD-10-CM | POA: Diagnosis not present

## 2016-06-11 DIAGNOSIS — R109 Unspecified abdominal pain: Secondary | ICD-10-CM | POA: Diagnosis not present

## 2016-07-27 DIAGNOSIS — E782 Mixed hyperlipidemia: Secondary | ICD-10-CM | POA: Diagnosis not present

## 2016-07-27 DIAGNOSIS — G4700 Insomnia, unspecified: Secondary | ICD-10-CM | POA: Diagnosis not present

## 2016-07-27 DIAGNOSIS — E559 Vitamin D deficiency, unspecified: Secondary | ICD-10-CM | POA: Diagnosis not present

## 2016-07-27 DIAGNOSIS — R630 Anorexia: Secondary | ICD-10-CM | POA: Diagnosis not present

## 2016-07-27 DIAGNOSIS — N3943 Post-void dribbling: Secondary | ICD-10-CM | POA: Diagnosis not present

## 2016-07-27 DIAGNOSIS — Z0000 Encounter for general adult medical examination without abnormal findings: Secondary | ICD-10-CM | POA: Diagnosis not present

## 2016-07-27 DIAGNOSIS — M7580 Other shoulder lesions, unspecified shoulder: Secondary | ICD-10-CM | POA: Diagnosis not present

## 2016-07-27 DIAGNOSIS — Z1389 Encounter for screening for other disorder: Secondary | ICD-10-CM | POA: Diagnosis not present

## 2016-07-27 DIAGNOSIS — I714 Abdominal aortic aneurysm, without rupture: Secondary | ICD-10-CM | POA: Diagnosis not present

## 2016-07-27 DIAGNOSIS — I10 Essential (primary) hypertension: Secondary | ICD-10-CM | POA: Diagnosis not present

## 2016-07-27 DIAGNOSIS — Z720 Tobacco use: Secondary | ICD-10-CM | POA: Diagnosis not present

## 2016-07-27 DIAGNOSIS — Z79899 Other long term (current) drug therapy: Secondary | ICD-10-CM | POA: Diagnosis not present

## 2016-07-27 DIAGNOSIS — K219 Gastro-esophageal reflux disease without esophagitis: Secondary | ICD-10-CM | POA: Diagnosis not present

## 2016-07-27 DIAGNOSIS — Z1211 Encounter for screening for malignant neoplasm of colon: Secondary | ICD-10-CM | POA: Diagnosis not present

## 2016-07-27 DIAGNOSIS — Z0001 Encounter for general adult medical examination with abnormal findings: Secondary | ICD-10-CM | POA: Diagnosis not present

## 2016-07-27 DIAGNOSIS — R634 Abnormal weight loss: Secondary | ICD-10-CM | POA: Diagnosis not present

## 2016-07-27 DIAGNOSIS — D81818 Other biotin-dependent carboxylase deficiency: Secondary | ICD-10-CM | POA: Diagnosis not present

## 2016-07-27 DIAGNOSIS — Z125 Encounter for screening for malignant neoplasm of prostate: Secondary | ICD-10-CM | POA: Diagnosis not present

## 2016-08-12 ENCOUNTER — Ambulatory Visit (INDEPENDENT_AMBULATORY_CARE_PROVIDER_SITE_OTHER)
Admission: RE | Admit: 2016-08-12 | Discharge: 2016-08-12 | Disposition: A | Discharge status: Home / Self Care | Payer: PPO | Source: Ambulatory Visit | Attending: Acute Care | Admitting: Acute Care

## 2016-08-12 ENCOUNTER — Encounter (INDEPENDENT_AMBULATORY_CARE_PROVIDER_SITE_OTHER): Payer: Self-pay

## 2016-08-12 DIAGNOSIS — F17210 Nicotine dependence, cigarettes, uncomplicated: Principal | ICD-10-CM

## 2016-08-12 DIAGNOSIS — Z87891 Personal history of nicotine dependence: Secondary | ICD-10-CM

## 2016-08-18 ENCOUNTER — Telehealth: Payer: Self-pay | Admitting: Acute Care

## 2016-08-18 DIAGNOSIS — F17210 Nicotine dependence, cigarettes, uncomplicated: Principal | ICD-10-CM

## 2016-08-18 NOTE — Telephone Encounter (Signed)
Routing to lung cancer screening pool.  

## 2016-08-18 NOTE — Telephone Encounter (Signed)
LMTC x 1  

## 2016-08-18 NOTE — Telephone Encounter (Signed)
Spoke with pt and informed of ct results per Eric Form, NP.  Pt verbalized understanding.  Copy sent to PCP.  Order placed for 1 yr f/u low dose ct.

## 2016-08-23 DIAGNOSIS — Z85828 Personal history of other malignant neoplasm of skin: Secondary | ICD-10-CM | POA: Diagnosis not present

## 2016-08-23 DIAGNOSIS — L812 Freckles: Secondary | ICD-10-CM | POA: Diagnosis not present

## 2016-08-23 DIAGNOSIS — D1801 Hemangioma of skin and subcutaneous tissue: Secondary | ICD-10-CM | POA: Diagnosis not present

## 2016-08-23 DIAGNOSIS — B354 Tinea corporis: Secondary | ICD-10-CM | POA: Diagnosis not present

## 2016-08-23 DIAGNOSIS — L821 Other seborrheic keratosis: Secondary | ICD-10-CM | POA: Diagnosis not present

## 2016-10-18 DIAGNOSIS — H2513 Age-related nuclear cataract, bilateral: Secondary | ICD-10-CM | POA: Diagnosis not present

## 2016-10-18 DIAGNOSIS — H524 Presbyopia: Secondary | ICD-10-CM | POA: Diagnosis not present

## 2016-10-18 DIAGNOSIS — H5203 Hypermetropia, bilateral: Secondary | ICD-10-CM | POA: Diagnosis not present

## 2017-02-12 DIAGNOSIS — E782 Mixed hyperlipidemia: Secondary | ICD-10-CM | POA: Diagnosis not present

## 2017-02-12 DIAGNOSIS — I10 Essential (primary) hypertension: Secondary | ICD-10-CM | POA: Diagnosis not present

## 2017-03-11 ENCOUNTER — Other Ambulatory Visit: Payer: Self-pay | Admitting: Internal Medicine

## 2017-03-11 ENCOUNTER — Ambulatory Visit
Admission: RE | Admit: 2017-03-11 | Discharge: 2017-03-11 | Disposition: A | Discharge status: Home / Self Care | Payer: PPO | Source: Ambulatory Visit | Attending: Internal Medicine | Admitting: Internal Medicine

## 2017-03-11 DIAGNOSIS — G4489 Other headache syndrome: Secondary | ICD-10-CM

## 2017-03-11 DIAGNOSIS — R51 Headache: Secondary | ICD-10-CM | POA: Diagnosis not present

## 2017-03-11 DIAGNOSIS — R42 Dizziness and giddiness: Secondary | ICD-10-CM | POA: Diagnosis not present

## 2017-03-23 DIAGNOSIS — D126 Benign neoplasm of colon, unspecified: Secondary | ICD-10-CM | POA: Diagnosis not present

## 2017-03-23 DIAGNOSIS — Z86010 Personal history of colonic polyps: Secondary | ICD-10-CM | POA: Diagnosis not present

## 2017-03-23 DIAGNOSIS — K644 Residual hemorrhoidal skin tags: Secondary | ICD-10-CM | POA: Diagnosis not present

## 2017-03-23 DIAGNOSIS — K635 Polyp of colon: Secondary | ICD-10-CM | POA: Diagnosis not present

## 2017-03-23 DIAGNOSIS — K648 Other hemorrhoids: Secondary | ICD-10-CM | POA: Diagnosis not present

## 2017-03-23 DIAGNOSIS — K5730 Diverticulosis of large intestine without perforation or abscess without bleeding: Secondary | ICD-10-CM | POA: Diagnosis not present

## 2017-03-29 DIAGNOSIS — D126 Benign neoplasm of colon, unspecified: Secondary | ICD-10-CM | POA: Diagnosis not present

## 2017-03-29 DIAGNOSIS — K635 Polyp of colon: Secondary | ICD-10-CM | POA: Diagnosis not present

## 2017-04-12 DIAGNOSIS — I10 Essential (primary) hypertension: Secondary | ICD-10-CM | POA: Diagnosis not present

## 2017-04-12 DIAGNOSIS — E782 Mixed hyperlipidemia: Secondary | ICD-10-CM | POA: Diagnosis not present

## 2017-07-08 DIAGNOSIS — J329 Chronic sinusitis, unspecified: Secondary | ICD-10-CM | POA: Diagnosis not present

## 2017-08-02 ENCOUNTER — Other Ambulatory Visit: Payer: Self-pay | Admitting: Internal Medicine

## 2017-08-02 DIAGNOSIS — I714 Abdominal aortic aneurysm, without rupture, unspecified: Secondary | ICD-10-CM

## 2017-08-02 DIAGNOSIS — G5601 Carpal tunnel syndrome, right upper limb: Secondary | ICD-10-CM | POA: Diagnosis not present

## 2017-08-02 DIAGNOSIS — K219 Gastro-esophageal reflux disease without esophagitis: Secondary | ICD-10-CM | POA: Diagnosis not present

## 2017-08-02 DIAGNOSIS — R202 Paresthesia of skin: Secondary | ICD-10-CM | POA: Diagnosis not present

## 2017-08-02 DIAGNOSIS — D81818 Other biotin-dependent carboxylase deficiency: Secondary | ICD-10-CM | POA: Diagnosis not present

## 2017-08-02 DIAGNOSIS — Z0000 Encounter for general adult medical examination without abnormal findings: Secondary | ICD-10-CM | POA: Diagnosis not present

## 2017-08-02 DIAGNOSIS — I10 Essential (primary) hypertension: Secondary | ICD-10-CM | POA: Diagnosis not present

## 2017-08-02 DIAGNOSIS — H8110 Benign paroxysmal vertigo, unspecified ear: Secondary | ICD-10-CM | POA: Diagnosis not present

## 2017-08-02 DIAGNOSIS — E782 Mixed hyperlipidemia: Secondary | ICD-10-CM | POA: Diagnosis not present

## 2017-08-02 DIAGNOSIS — Z1389 Encounter for screening for other disorder: Secondary | ICD-10-CM | POA: Diagnosis not present

## 2017-08-02 DIAGNOSIS — Z125 Encounter for screening for malignant neoplasm of prostate: Secondary | ICD-10-CM | POA: Diagnosis not present

## 2017-08-02 DIAGNOSIS — E559 Vitamin D deficiency, unspecified: Secondary | ICD-10-CM | POA: Diagnosis not present

## 2017-08-02 DIAGNOSIS — Z79899 Other long term (current) drug therapy: Secondary | ICD-10-CM | POA: Diagnosis not present

## 2017-08-02 DIAGNOSIS — Z86010 Personal history of colonic polyps: Secondary | ICD-10-CM | POA: Diagnosis not present

## 2017-08-12 ENCOUNTER — Other Ambulatory Visit: Payer: PPO

## 2017-08-15 ENCOUNTER — Ambulatory Visit
Admission: RE | Admit: 2017-08-15 | Discharge: 2017-08-15 | Disposition: A | Discharge status: Home / Self Care | Payer: PPO | Source: Ambulatory Visit | Attending: Internal Medicine | Admitting: Internal Medicine

## 2017-08-15 DIAGNOSIS — I714 Abdominal aortic aneurysm, without rupture, unspecified: Secondary | ICD-10-CM

## 2017-08-16 ENCOUNTER — Ambulatory Visit (INDEPENDENT_AMBULATORY_CARE_PROVIDER_SITE_OTHER)
Admission: RE | Admit: 2017-08-16 | Discharge: 2017-08-16 | Disposition: A | Discharge status: Home / Self Care | Payer: PPO | Source: Ambulatory Visit | Attending: Acute Care | Admitting: Acute Care

## 2017-08-16 DIAGNOSIS — F17210 Nicotine dependence, cigarettes, uncomplicated: Secondary | ICD-10-CM

## 2017-08-16 DIAGNOSIS — Z87891 Personal history of nicotine dependence: Secondary | ICD-10-CM | POA: Diagnosis not present

## 2017-08-26 ENCOUNTER — Other Ambulatory Visit: Payer: Self-pay | Admitting: Acute Care

## 2017-08-26 DIAGNOSIS — L570 Actinic keratosis: Secondary | ICD-10-CM | POA: Diagnosis not present

## 2017-08-26 DIAGNOSIS — B351 Tinea unguium: Secondary | ICD-10-CM | POA: Diagnosis not present

## 2017-08-26 DIAGNOSIS — Z122 Encounter for screening for malignant neoplasm of respiratory organs: Secondary | ICD-10-CM

## 2017-08-26 DIAGNOSIS — Z85828 Personal history of other malignant neoplasm of skin: Secondary | ICD-10-CM | POA: Diagnosis not present

## 2017-08-26 DIAGNOSIS — D225 Melanocytic nevi of trunk: Secondary | ICD-10-CM | POA: Diagnosis not present

## 2017-08-26 DIAGNOSIS — L300 Nummular dermatitis: Secondary | ICD-10-CM | POA: Diagnosis not present

## 2017-08-26 DIAGNOSIS — L308 Other specified dermatitis: Secondary | ICD-10-CM | POA: Diagnosis not present

## 2017-08-26 DIAGNOSIS — L812 Freckles: Secondary | ICD-10-CM | POA: Diagnosis not present

## 2017-08-26 DIAGNOSIS — Z87891 Personal history of nicotine dependence: Secondary | ICD-10-CM

## 2017-09-17 ENCOUNTER — Ambulatory Visit (HOSPITAL_COMMUNITY)
Admission: EM | Admit: 2017-09-17 | Discharge: 2017-09-17 | Disposition: A | Discharge status: Home / Self Care | Payer: PPO | Attending: Family Medicine | Admitting: Family Medicine

## 2017-09-17 ENCOUNTER — Encounter (HOSPITAL_COMMUNITY): Payer: Self-pay | Admitting: Emergency Medicine

## 2017-09-17 DIAGNOSIS — W57XXXA Bitten or stung by nonvenomous insect and other nonvenomous arthropods, initial encounter: Secondary | ICD-10-CM | POA: Diagnosis not present

## 2017-09-17 DIAGNOSIS — S80861A Insect bite (nonvenomous), right lower leg, initial encounter: Secondary | ICD-10-CM | POA: Diagnosis not present

## 2017-09-17 MED ORDER — DOXYCYCLINE HYCLATE 100 MG PO CAPS: 200.0000 mg | ORAL_CAPSULE | Freq: Once | ORAL | 0 refills | Status: AC

## 2017-09-17 NOTE — ED Triage Notes (Signed)
Pt states he noticed a tick bite on his leg, thought it was a scab 3 days ago, tried to pull it otu but thinks he missed the head. Large bite area noticed to R lower leg with redness and swelling

## 2017-09-17 NOTE — ED Provider Notes (Signed)
  [facility]    CSN: [id number] Arrival date & time: 09/17/17  1825   Chief Complaint  Patient presents with  . Tick Removal    Subjective: Patient is a 72 y.o. male here for a tick bite.  Noticed this AM at soccer game. Pulled it off, unsure if anything left behind. No fevers, drainage, pain. Brought tick in.    ROS: Skin: As noted in HPI  No family history on file. Past Medical History:  Diagnosis Date  . Allergy   . DJD (degenerative joint disease)   . Erectile dysfunction   . GERD (gastroesophageal reflux disease)   . Hearing loss   . Hemorrhagic gastritis   . Hx of colonoscopy 2007  . Hyperlipidemia   . Hypertension   . Insomnia   . Lumbago   . Other B-complex deficiencies   . Peripheral vascular disease ([place])   . Renal calculi    Allergies  Allergen Reactions  . Bee Venom Swelling   No current facility-administered medications for this encounter.   Current Outpatient Medications:  .  aspirin 81 MG tablet, Take 81 mg by mouth daily., Disp: , Rfl:  .  atenolol (TENORMIN) 25 MG tablet, Take 25 mg by mouth daily., Disp: , Rfl:  .  clonazePAM (KLONOPIN) 2 MG tablet, Take 2 mg by mouth 2 (two) times daily as needed., Disp: , Rfl:  .  doxycycline (VIBRAMYCIN) 100 MG capsule, Take 2 capsules (200 mg total) by mouth once for 1 dose., Disp: 2 capsule, Rfl: 0 .  EPINEPHrine (EPIPEN JR) 0.15 MG/0.3ML injection, Inject 0.15 mg into the muscle as needed., Disp: , Rfl:  .  esomeprazole (NEXIUM) 40 MG capsule, Take 40 mg by mouth daily before breakfast., Disp: , Rfl:  .  felodipine (PLENDIL) 5 MG 24 hr tablet, Take 5 mg by mouth daily., Disp: , Rfl:  .  gemfibrozil (LOPID) 600 MG tablet, Take 600 mg by mouth 2 (two) times daily before a meal., Disp: , Rfl:  .  simvastatin (ZOCOR) 20 MG tablet, Take 20 mg by mouth every evening., Disp: , Rfl:  .  tamsulosin (FLOMAX) 0.4 MG CAPS capsule, Take 1 capsule (0.4 mg total) by mouth daily., Disp: 14 capsule, Rfl: 0 .   traMADol (ULTRAM) 50 MG tablet, Take 50 mg by mouth every 6 (six) hours as needed., Disp: , Rfl:   Objective: BP 138/71   Pulse 64   Temp 98.1 F (36.7 C)   Resp 18   SpO2 94%  General: Awake, appears stated age Lungs: CTAB, no rales, wheezes or rhonchi. No accessory muscle use Skin: There is a 2 mm opening on the ant portion of RLE with surrounding erythema; no fluctuance, ttp or drainage Psych: Age appropriate judgment and insight, normal affect and mood  Assessment and Plan: Tick bite, initial encounter  Doxy 200 mg x1. Discussed a target lesion and showed pics. If worsening/new s/s's, seek care. F/u w pcp prn. The patient voiced understanding and agreement to the plan.       Shelda Pal, [place] 09/17/17 1931

## 2017-10-13 DIAGNOSIS — H5203 Hypermetropia, bilateral: Secondary | ICD-10-CM | POA: Diagnosis not present

## 2017-10-13 DIAGNOSIS — H2513 Age-related nuclear cataract, bilateral: Secondary | ICD-10-CM | POA: Diagnosis not present

## 2017-10-13 DIAGNOSIS — H524 Presbyopia: Secondary | ICD-10-CM | POA: Diagnosis not present

## 2017-11-29 DIAGNOSIS — W57XXXA Bitten or stung by nonvenomous insect and other nonvenomous arthropods, initial encounter: Secondary | ICD-10-CM | POA: Diagnosis not present

## 2017-11-29 DIAGNOSIS — M7989 Other specified soft tissue disorders: Secondary | ICD-10-CM | POA: Diagnosis not present

## 2018-01-02 DIAGNOSIS — L02419 Cutaneous abscess of limb, unspecified: Secondary | ICD-10-CM | POA: Diagnosis not present

## 2018-02-02 DIAGNOSIS — Z23 Encounter for immunization: Secondary | ICD-10-CM | POA: Diagnosis not present

## 2018-02-08 DIAGNOSIS — E782 Mixed hyperlipidemia: Secondary | ICD-10-CM | POA: Diagnosis not present

## 2018-02-08 DIAGNOSIS — I10 Essential (primary) hypertension: Secondary | ICD-10-CM | POA: Diagnosis not present

## 2018-03-13 DIAGNOSIS — E782 Mixed hyperlipidemia: Secondary | ICD-10-CM | POA: Diagnosis not present

## 2018-03-13 DIAGNOSIS — I10 Essential (primary) hypertension: Secondary | ICD-10-CM | POA: Diagnosis not present

## 2018-06-14 DIAGNOSIS — E782 Mixed hyperlipidemia: Secondary | ICD-10-CM | POA: Diagnosis not present

## 2018-06-14 DIAGNOSIS — I10 Essential (primary) hypertension: Secondary | ICD-10-CM | POA: Diagnosis not present

## 2018-08-15 DIAGNOSIS — E782 Mixed hyperlipidemia: Secondary | ICD-10-CM | POA: Diagnosis not present

## 2018-08-15 DIAGNOSIS — I10 Essential (primary) hypertension: Secondary | ICD-10-CM | POA: Diagnosis not present

## 2018-08-16 DIAGNOSIS — I10 Essential (primary) hypertension: Secondary | ICD-10-CM | POA: Diagnosis not present

## 2018-08-16 DIAGNOSIS — E782 Mixed hyperlipidemia: Secondary | ICD-10-CM | POA: Diagnosis not present

## 2018-08-18 ENCOUNTER — Inpatient Hospital Stay: Admission: RE | Admit: 2018-08-18 | Payer: PPO | Source: Ambulatory Visit

## 2018-08-22 DIAGNOSIS — G4700 Insomnia, unspecified: Secondary | ICD-10-CM | POA: Diagnosis not present

## 2018-08-22 DIAGNOSIS — E782 Mixed hyperlipidemia: Secondary | ICD-10-CM | POA: Diagnosis not present

## 2018-08-22 DIAGNOSIS — R0609 Other forms of dyspnea: Secondary | ICD-10-CM | POA: Diagnosis not present

## 2018-08-22 DIAGNOSIS — J309 Allergic rhinitis, unspecified: Secondary | ICD-10-CM | POA: Diagnosis not present

## 2018-08-22 DIAGNOSIS — I10 Essential (primary) hypertension: Secondary | ICD-10-CM | POA: Diagnosis not present

## 2018-08-22 DIAGNOSIS — E559 Vitamin D deficiency, unspecified: Secondary | ICD-10-CM | POA: Diagnosis not present

## 2018-08-22 DIAGNOSIS — E538 Deficiency of other specified B group vitamins: Secondary | ICD-10-CM | POA: Diagnosis not present

## 2018-08-22 DIAGNOSIS — I700 Atherosclerosis of aorta: Secondary | ICD-10-CM | POA: Diagnosis not present

## 2018-08-22 DIAGNOSIS — I714 Abdominal aortic aneurysm, without rupture: Secondary | ICD-10-CM | POA: Diagnosis not present

## 2018-08-22 DIAGNOSIS — Z801 Family history of malignant neoplasm of trachea, bronchus and lung: Secondary | ICD-10-CM | POA: Diagnosis not present

## 2018-08-22 DIAGNOSIS — N3943 Post-void dribbling: Secondary | ICD-10-CM | POA: Diagnosis not present

## 2018-08-25 ENCOUNTER — Other Ambulatory Visit: Payer: Self-pay | Admitting: Internal Medicine

## 2018-08-25 DIAGNOSIS — I714 Abdominal aortic aneurysm, without rupture, unspecified: Secondary | ICD-10-CM

## 2018-08-29 ENCOUNTER — Ambulatory Visit (INDEPENDENT_AMBULATORY_CARE_PROVIDER_SITE_OTHER): Payer: PPO | Admitting: Cardiology

## 2018-08-29 ENCOUNTER — Other Ambulatory Visit: Payer: Self-pay

## 2018-08-29 ENCOUNTER — Encounter: Payer: Self-pay | Admitting: Cardiology

## 2018-08-29 VITALS — BP 124/61 | HR 60 | Ht 70.0 in | Wt 206.0 lb

## 2018-08-29 DIAGNOSIS — Z87891 Personal history of nicotine dependence: Secondary | ICD-10-CM

## 2018-08-29 DIAGNOSIS — R0609 Other forms of dyspnea: Secondary | ICD-10-CM | POA: Insufficient documentation

## 2018-08-29 DIAGNOSIS — E782 Mixed hyperlipidemia: Secondary | ICD-10-CM | POA: Insufficient documentation

## 2018-08-29 DIAGNOSIS — M1990 Unspecified osteoarthritis, unspecified site: Secondary | ICD-10-CM | POA: Insufficient documentation

## 2018-08-29 DIAGNOSIS — I2510 Atherosclerotic heart disease of native coronary artery without angina pectoris: Secondary | ICD-10-CM | POA: Insufficient documentation

## 2018-08-29 DIAGNOSIS — I739 Peripheral vascular disease, unspecified: Secondary | ICD-10-CM | POA: Insufficient documentation

## 2018-08-29 MED ORDER — NITROGLYCERIN 0.4 MG SL SUBL: 0.4000 mg | SUBLINGUAL_TABLET | SUBLINGUAL | 3 refills | Status: DC | PRN

## 2018-08-29 NOTE — Progress Notes (Signed)
Virtual Visit via Video Note   Subjective:   William Warner, male    DOB: [birth year], 73 y.o.   MRN: [id number]   I connected with the patient on 08/29/18 by a telephone call and verified that I am speaking with the correct person using two identifiers.     Patient was unable to connect through a video based application.   I discussed the limitations of evaluation and management by telemedicine and the availability of in person appointments. The patient expressed understanding and agreed to proceed.   This visit type was conducted due to national recommendations for restrictions regarding the COVID-19 Pandemic (e.g. social distancing).  This format is felt to be most appropriate for this patient at this time.  All issues noted in this document were discussed and addressed.  No physical exam was performed (except for noted visual exam findings with Tele health visits).  The patient has consented to conduct a Tele health visit and understands insurance will be billed.     Chief complaint:  Exertional dyspnea Possible coronary artery disease   HPI  73 y.o. Caucasian male with hypertension, stable AAA, aortic atherosclerosis, mixed hyperlipidemia, h/o tobacco abuse, referred for evaluation of exertional dyspnea and possible coronary artery disease.  Patient lives with his wife, does little physical activity. He was supposed to join a gym before pandemic started. A few weeks ago, he had an episode of shortness of breath while working in the yard and lifting heavy bags of yard waste. He had similar symptoms in the next week. He admits that he had  Not done that level of physical activity anytime recently. He denies any chest pain. He has 43 PY smoking history, quit in 2018. He has never had PFT.   Past Medical History:  Diagnosis Date  . Allergy   . DJD (degenerative joint disease)   . Erectile dysfunction   . GERD (gastroesophageal reflux disease)   . Hearing loss   . Hemorrhagic  gastritis   . Hx of colonoscopy 2007  . Hyperlipidemia   . Hypertension   . Insomnia   . Lumbago   . Other B-complex deficiencies   . Peripheral vascular disease ([place])   . Renal calculi      Past Surgical History:  Procedure Laterality Date  . TONSILLECTOMY       Social History   Socioeconomic History  . Marital status: Married    Spouse name: Not on file  . Number of children: Not on file  . Years of education: Not on file  . Highest education level: Not on file  Occupational History  . Not on file  Social Needs  . Financial resource strain: Not on file  . Food insecurity:    Worry: Not on file    Inability: Not on file  . Transportation needs:    Medical: Not on file    Non-medical: Not on file  Tobacco Use  . Smoking status: Former Smoker    Packs/day: 1.00    Years: 54.00    Pack years: 54.00    Types: Cigarettes    Last attempt to quit: 05/17/2016    Years since quitting: 2.2  . Tobacco comment: Encouraged to quit smoking. Nicotine replacement therapy and behavior modification discussed  Substance and Sexual Activity  . Alcohol use: No    Alcohol/week: 0.0 standard drinks    Comment: Quit 2014, drank heavily before that   . Drug use: No  . Sexual activity: Not on  file  Lifestyle  . Physical activity:    Days per week: Not on file    Minutes per session: Not on file  . Stress: Not on file  Relationships  . Social connections:    Talks on phone: Not on file    Gets together: Not on file    Attends religious service: Not on file    Active member of club or organization: Not on file    Attends meetings of clubs or organizations: Not on file    Relationship status: Not on file  . Intimate partner violence:    Fear of current or ex partner: Not on file    Emotionally abused: Not on file    Physically abused: Not on file    Forced sexual activity: Not on file  Other Topics Concern  . Not on file  Social History Narrative  . Not on file      Family History  Problem Relation Age of Onset  . Lung cancer Mother   . Peripheral vascular disease Father      Current Outpatient Medications on File Prior to Visit  Medication Sig Dispense Refill  . aspirin 81 MG tablet Take 81 mg by mouth daily.    Marland Kitchen atenolol (TENORMIN) 25 MG tablet Take 25 mg by mouth daily.    . Cholecalciferol (D3 DOTS) 50 MCG (2000 UT) TBDP Take 2,000 Units by mouth daily.    . clonazePAM (KLONOPIN) 2 MG tablet Take 2 mg by mouth daily.     Marland Kitchen EPINEPHrine (EPIPEN JR) 0.15 MG/0.3ML injection Inject 0.15 mg into the muscle as needed.    Marland Kitchen esomeprazole (NEXIUM) 40 MG capsule Take 40 mg by mouth daily before breakfast.    . fluticasone (FLONASE) 50 MCG/ACT nasal spray Place 1-2 sprays into both nostrils daily.    Marland Kitchen gemfibrozil (LOPID) 600 MG tablet Take 600 mg by mouth daily.     . meclizine (ANTIVERT) 12.5 MG tablet Take 12.5 mg by mouth 3 times/day as needed-between meals & bedtime for dizziness.    . Multiple Vitamin tablet Take 1 tablet by mouth daily.    . simvastatin (ZOCOR) 20 MG tablet Take 20 mg by mouth every evening.    . tamsulosin (FLOMAX) 0.4 MG CAPS capsule Take 1 capsule (0.4 mg total) by mouth daily. 14 capsule 0   No current facility-administered medications on file prior to visit.     Cardiovascular studies:  CT Chest 08/16/2017: 1. Lung-RADS 1, negative. Continue annual screening with low-dose chest CT without contrast in 12 months. 2. Aortic atherosclerosis (ICD10-170.0). Coronary artery calcification. 3.  Emphysema (ICD10-J43.9).  US abdomen 08/15/2017: 1. No acute findings. 2. Infrarenal abdominal aortic aneurysm measures 3.0 cm anterior-posterior by 3.5 cm right to left, without significant change from the prior CT allowing for the differences in imaging modalities, and measurement technique. Recommend followup by ultrasound in 3 years. This recommendation follows ACR consensus guidelines: White Paper of the ACR Incidental Findings  Committee II on Vascular Findings. J Am Coll Radiol 2013; 45:409-811 3. Small nonobstructing stones in each kidney.  No hydronephrosis.  Recent labs: 08/02/2017: Glucose 97, BUN/Cr 9/0.87. eGFR 86. Na/K 142/4.5. Rest of the CMP normal.  H/H 14/44. MCV 92. Platelets 193.  Chol 201, TG 196, HDL 47, LDL 115.   Review of Systems  Constitution: Negative for decreased appetite, malaise/fatigue, weight gain and weight loss.  HENT: Negative for congestion.   Eyes: Negative for visual disturbance.  Cardiovascular: Positive for dyspnea on exertion  and leg swelling (Minimal). Negative for chest pain, palpitations and syncope.  Respiratory: Positive for shortness of breath.   Endocrine: Negative for cold intolerance.  Hematologic/Lymphatic: Does not bruise/bleed easily.  Skin: Negative for itching and rash.  Musculoskeletal: Negative for myalgias.  Gastrointestinal: Negative for abdominal pain, nausea and vomiting.  Genitourinary: Negative for dysuria.  Neurological: Negative for dizziness and weakness.  Psychiatric/Behavioral: The patient is not nervous/anxious.   All other systems reviewed and are negative.       Vitals:   08/29/18 1339  BP: 124/61  Pulse: 60   (Measured by the patient using a home BP monitor)  (08/22/2018) P 62 bpm, BP 126/78 mmHg, Wt 201.6 lb, Ht 68 In, temp 97.9 F,   Observation/findings during video visit   Objective:     Physical exam: Not performed, as this is a telephone call       Assessment & Recommendations:   73 y.o. Caucasian male with hypertension, stable AAA, aortic atherosclerosis, mixed hyperlipidemia, h/o tobacco abuse, referred for evaluation of exertional dyspnea and possible coronary artery disease  1. DOE (dyspnea on exertion) Differential includes COPD, pulmonary hypertension, obstructive CAD. He has not had PFT. Lung cancer CT scan shows coronary artery calcification, although physiological significance remains unknown. At some  point, he will need echocardiogram and stress test. His symptoms are infrequent and not severe at this time. I gave him the option of proceeding with the testing now vs wait till June when COVID outreak is hopefully on the wane. He would like to wait till June. In the meantime, he may contact us anytime with any new symptoms. I have given him SL NTG prescription for emergency use. Continue aspirin 81 mg daily. I suggested changing simvastatin to rosuvastatin. However, he would like to hold off the change for now  I will see him in June after echocardiogram. If symptoms persistent, will then do stress test. Also recommend PFT at some point.    Nigel Mormon, MD [facility] Pager: [phone] Office: [phone] If no answer Cell [phone]

## 2018-09-27 DIAGNOSIS — Z85828 Personal history of other malignant neoplasm of skin: Secondary | ICD-10-CM | POA: Diagnosis not present

## 2018-09-27 DIAGNOSIS — L300 Nummular dermatitis: Secondary | ICD-10-CM | POA: Diagnosis not present

## 2018-09-27 DIAGNOSIS — L821 Other seborrheic keratosis: Secondary | ICD-10-CM | POA: Diagnosis not present

## 2018-09-27 DIAGNOSIS — D692 Other nonthrombocytopenic purpura: Secondary | ICD-10-CM | POA: Diagnosis not present

## 2018-09-27 DIAGNOSIS — L570 Actinic keratosis: Secondary | ICD-10-CM | POA: Diagnosis not present

## 2018-09-27 DIAGNOSIS — L308 Other specified dermatitis: Secondary | ICD-10-CM | POA: Diagnosis not present

## 2018-10-03 ENCOUNTER — Other Ambulatory Visit: Payer: Self-pay | Admitting: Acute Care

## 2018-10-03 DIAGNOSIS — Z87891 Personal history of nicotine dependence: Secondary | ICD-10-CM

## 2018-10-03 DIAGNOSIS — I10 Essential (primary) hypertension: Secondary | ICD-10-CM | POA: Diagnosis not present

## 2018-10-03 DIAGNOSIS — Z122 Encounter for screening for malignant neoplasm of respiratory organs: Secondary | ICD-10-CM

## 2018-10-03 DIAGNOSIS — E782 Mixed hyperlipidemia: Secondary | ICD-10-CM | POA: Diagnosis not present

## 2018-10-17 ENCOUNTER — Ambulatory Visit
Admission: RE | Admit: 2018-10-17 | Discharge: 2018-10-17 | Disposition: A | Discharge status: Home / Self Care | Payer: PPO | Source: Ambulatory Visit | Attending: Internal Medicine | Admitting: Internal Medicine

## 2018-10-17 DIAGNOSIS — R0609 Other forms of dyspnea: Secondary | ICD-10-CM | POA: Diagnosis not present

## 2018-10-17 DIAGNOSIS — H5203 Hypermetropia, bilateral: Secondary | ICD-10-CM | POA: Diagnosis not present

## 2018-10-17 DIAGNOSIS — I10 Essential (primary) hypertension: Secondary | ICD-10-CM | POA: Diagnosis not present

## 2018-10-17 DIAGNOSIS — I714 Abdominal aortic aneurysm, without rupture, unspecified: Secondary | ICD-10-CM

## 2018-10-17 DIAGNOSIS — E559 Vitamin D deficiency, unspecified: Secondary | ICD-10-CM | POA: Diagnosis not present

## 2018-10-17 DIAGNOSIS — E782 Mixed hyperlipidemia: Secondary | ICD-10-CM | POA: Diagnosis not present

## 2018-10-17 DIAGNOSIS — E538 Deficiency of other specified B group vitamins: Secondary | ICD-10-CM | POA: Diagnosis not present

## 2018-10-17 DIAGNOSIS — H524 Presbyopia: Secondary | ICD-10-CM | POA: Diagnosis not present

## 2018-10-17 DIAGNOSIS — N3943 Post-void dribbling: Secondary | ICD-10-CM | POA: Diagnosis not present

## 2018-10-17 DIAGNOSIS — H25813 Combined forms of age-related cataract, bilateral: Secondary | ICD-10-CM | POA: Diagnosis not present

## 2018-10-25 ENCOUNTER — Telehealth: Payer: Self-pay | Admitting: *Deleted

## 2018-10-25 NOTE — Telephone Encounter (Signed)

## 2018-10-27 ENCOUNTER — Other Ambulatory Visit: Payer: Self-pay

## 2018-10-27 ENCOUNTER — Ambulatory Visit (INDEPENDENT_AMBULATORY_CARE_PROVIDER_SITE_OTHER)
Admission: RE | Admit: 2018-10-27 | Discharge: 2018-10-27 | Disposition: A | Discharge status: Home / Self Care | Payer: PPO | Source: Ambulatory Visit | Attending: Acute Care | Admitting: Acute Care

## 2018-10-27 DIAGNOSIS — Z122 Encounter for screening for malignant neoplasm of respiratory organs: Secondary | ICD-10-CM

## 2018-10-27 DIAGNOSIS — Z87891 Personal history of nicotine dependence: Secondary | ICD-10-CM | POA: Diagnosis not present

## 2018-11-01 ENCOUNTER — Other Ambulatory Visit: Payer: Self-pay | Admitting: Acute Care

## 2018-11-01 DIAGNOSIS — Z87891 Personal history of nicotine dependence: Secondary | ICD-10-CM

## 2018-11-01 DIAGNOSIS — Z122 Encounter for screening for malignant neoplasm of respiratory organs: Secondary | ICD-10-CM

## 2018-11-06 ENCOUNTER — Other Ambulatory Visit: Payer: Self-pay

## 2018-11-06 ENCOUNTER — Ambulatory Visit (INDEPENDENT_AMBULATORY_CARE_PROVIDER_SITE_OTHER): Payer: PPO

## 2018-11-06 DIAGNOSIS — R0609 Other forms of dyspnea: Secondary | ICD-10-CM

## 2018-11-11 DIAGNOSIS — E782 Mixed hyperlipidemia: Secondary | ICD-10-CM | POA: Diagnosis not present

## 2018-11-11 DIAGNOSIS — I10 Essential (primary) hypertension: Secondary | ICD-10-CM | POA: Diagnosis not present

## 2018-11-13 ENCOUNTER — Ambulatory Visit: Payer: PPO | Admitting: Cardiology

## 2018-12-05 ENCOUNTER — Encounter (HOSPITAL_COMMUNITY): Payer: Self-pay | Admitting: Family Medicine

## 2018-12-05 ENCOUNTER — Other Ambulatory Visit: Payer: Self-pay

## 2018-12-05 ENCOUNTER — Ambulatory Visit (HOSPITAL_COMMUNITY)
Admission: EM | Admit: 2018-12-05 | Discharge: 2018-12-05 | Disposition: A | Discharge status: Home / Self Care | Payer: PPO | Attending: Family Medicine | Admitting: Family Medicine

## 2018-12-05 DIAGNOSIS — W57XXXA Bitten or stung by nonvenomous insect and other nonvenomous arthropods, initial encounter: Secondary | ICD-10-CM

## 2018-12-05 DIAGNOSIS — S0006XA Insect bite (nonvenomous) of scalp, initial encounter: Secondary | ICD-10-CM | POA: Diagnosis not present

## 2018-12-05 DIAGNOSIS — I10 Essential (primary) hypertension: Secondary | ICD-10-CM

## 2018-12-05 NOTE — Discharge Instructions (Addendum)
No need for antibiotics today Keep monitoring for worsening problems.  Follow up as needed for continued or worsening symptoms

## 2018-12-05 NOTE — ED Triage Notes (Addendum)
noticed a bump behind right ear last night.  Area looks like scabbing.  Patient denies any itching to this area.  Patient is concerned for a tick

## 2018-12-06 NOTE — ED Provider Notes (Signed)
[facility]    CSN: [id number] Arrival date & time: 12/05/18  1454     History   Chief Complaint Chief Complaint  Patient presents with  . Insect Bite    HPI William Warner is a 73 y.o. male.   Patient is a 73 year old male presents today with foreign body behind the right ear.  He noticed this last night.  He is concerned for possible piece of tick left in the skin.  He has had multiple tick bites recently.  Denies any pain or itching.  Denies any rashes, fever or joint pain.  ROS per HPI      Past Medical History:  Diagnosis Date  . AAA (abdominal aortic aneurysm) ([place])   . Allergy   . B12 deficiency   . DJD (degenerative joint disease)   . DJD (degenerative joint disease)   . DOE (dyspnea on exertion)   . Erectile dysfunction   . GERD (gastroesophageal reflux disease)   . Hearing loss   . Hemorrhagic gastritis   . Hx of colonoscopy 2007  . Hyperlipidemia   . Hypertension   . Insomnia   . Lumbago   . Other B-complex deficiencies   . Peripheral vascular disease ([place])   . PVD (peripheral vascular disease) ([place])   . Renal calculi   . Vitamin D deficiency     Patient Active Problem List   Diagnosis Date Noted  . Former smoker 08/29/2018  . Coronary artery disease involving native coronary artery of native heart without angina pectoris 08/29/2018  . Mixed hyperlipidemia 08/29/2018  . DOE (dyspnea on exertion)   . PVD (peripheral vascular disease) ([place])   . DJD (degenerative joint disease)   . RUQ abdominal pain 01/06/2012    Past Surgical History:  Procedure Laterality Date  . COLONOSCOPY    . KNEE SURGERY    . TONSILLECTOMY         Home Medications    Prior to Admission medications   Medication Sig Start Date End Date Taking? Authorizing Provider  aspirin 81 MG tablet Take 81 mg by mouth daily.    [provider]  atenolol (TENORMIN) 25 MG tablet Take 25 mg by mouth daily.    [provider]  Cholecalciferol  (D3 DOTS) 50 MCG (2000 UT) TBDP Take 2,000 Units by mouth daily.    [provider]  clonazePAM (KLONOPIN) 2 MG tablet Take 2 mg by mouth daily.     [provider]  EPINEPHrine (EPIPEN JR) 0.15 MG/0.3ML injection Inject 0.15 mg into the muscle as needed.    [provider]  esomeprazole (NEXIUM) 40 MG capsule Take 40 mg by mouth daily before breakfast.    [provider]  fluticasone (FLONASE) 50 MCG/ACT nasal spray Place 1-2 sprays into both nostrils daily. 08/24/18   [provider]  gemfibrozil (LOPID) 600 MG tablet Take 600 mg by mouth daily.     [provider]  meclizine (ANTIVERT) 12.5 MG tablet Take 12.5 mg by mouth 3 times/day as needed-between meals & bedtime for dizziness.    [provider]  Multiple Vitamin tablet Take 1 tablet by mouth daily.    [provider]  nitroGLYCERIN (NITROSTAT) 0.4 MG SL tablet Place 1 tablet (0.4 mg total) under the tongue every 5 (five) minutes as needed for chest pain. 08/29/18 11/27/18  Patwardhan, Reynold Bowen, MD  simvastatin (ZOCOR) 20 MG tablet Take 20 mg by mouth every evening.    [provider]  tamsulosin (FLOMAX) 0.4 MG CAPS capsule Take 1 capsule (0.4 mg total) by mouth daily. 06/25/14   Mabe, Forbes Cellar, MD    Family History Family History  Problem Relation Age of Onset  . Lung cancer Mother   . Peripheral vascular disease Father     Social History Social History   Tobacco Use  . Smoking status: Former Smoker    Packs/day: 1.00    Years: 54.00    Pack years: 54.00    Types: Cigarettes    Quit date: 05/17/2016    Years since quitting: 2.5  . Smokeless tobacco: Never Used  Substance Use Topics  . Alcohol use: No    Alcohol/week: 0.0 standard drinks    Comment: Quit 2014, drank heavily before that   . Drug use: No     Allergies   Bee venom and Augmentin [amoxicillin-pot clavulanate]   Review of Systems Review of Systems   Physical Exam Triage Vital  Signs ED Triage Vitals  Enc Vitals Group     BP 12/05/18 1530 (!) 159/90     Pulse Rate 12/05/18 1530 (!) 54     Resp 12/05/18 1530 20     Temp 12/05/18 1530 98.2 F (36.8 C)     Temp Source 12/05/18 1530 Oral     SpO2 12/05/18 1530 97 %     Weight --      Height --      Head Circumference --      Peak Flow --      Pain Score 12/05/18 1526 0     Pain Loc --      Pain Edu? --      Excl. in [place]? --    No data found.  Updated Vital Signs BP (!) 159/90 (BP Location: Left Arm)   Pulse (!) 54   Temp 98.2 F (36.8 C) (Oral)   Resp 20   SpO2 97%   Visual Acuity Right Eye Distance:   Left Eye Distance:   Bilateral Distance:    Right Eye Near:   Left Eye Near:    Bilateral Near:     Physical Exam Vitals signs and nursing note reviewed.  Constitutional:      Appearance: Normal appearance.  HENT:     Head: Normocephalic and atraumatic.     Nose: Nose normal.  Eyes:     Conjunctiva/sclera: Conjunctivae normal.  Neck:     Musculoskeletal: Normal range of motion.  Pulmonary:     Effort: Pulmonary effort is normal.  Abdominal:     Palpations: Abdomen is soft.     Tenderness: There is no abdominal tenderness.  Musculoskeletal: Normal range of motion.  Skin:    General: Skin is warm and dry.     Comments: Hard, dark and possible scabbing area behind the right ear to scalp No redness, swelling Nontender  Neurological:     Mental Status: He is alert.  Psychiatric:        Mood and Affect: Mood normal.      UC Treatments / Results  Labs (all labs ordered are listed, but only abnormal results are displayed) Labs Reviewed - No data to display  EKG   Radiology No results found.  Procedures Procedures (including critical care time)  Medications Ordered in UC Medications - No data to display  Initial Impression / Assessment and Plan / UC Course  I have reviewed the triage vital signs and the nursing notes.  Pertinent labs & imaging results that were  available during my care of the patient were reviewed by me and considered in my medical decision making (see chart for details).     Removed what appeared to be a piece of a tick from scalp behind the right ear. Patient tolerated well. There was a small amount of pus Cleaned and dressed with Band-Aid and antibiotic ointment Recommend follow-up for any continued worsening problems to include fever, joint pain or rashes. Final Clinical Impressions(s) / UC Diagnoses   Final diagnoses:  None     Discharge Instructions     No need for antibiotics today Keep monitoring for worsening problems.  Follow up as needed for continued or worsening symptoms     ED Prescriptions    None     Controlled Substance Prescriptions [place] Controlled Substance Registry consulted? Not Applicable   Orvan July, NP 12/06/18 1003

## 2018-12-11 ENCOUNTER — Other Ambulatory Visit: Payer: Self-pay

## 2018-12-11 ENCOUNTER — Ambulatory Visit (INDEPENDENT_AMBULATORY_CARE_PROVIDER_SITE_OTHER): Payer: PPO | Admitting: Cardiology

## 2018-12-11 ENCOUNTER — Encounter: Payer: Self-pay | Admitting: Cardiology

## 2018-12-11 VITALS — BP 137/70 | HR 67 | Ht 70.0 in | Wt 199.1 lb

## 2018-12-11 DIAGNOSIS — E782 Mixed hyperlipidemia: Secondary | ICD-10-CM | POA: Diagnosis not present

## 2018-12-11 DIAGNOSIS — I2510 Atherosclerotic heart disease of native coronary artery without angina pectoris: Secondary | ICD-10-CM | POA: Diagnosis not present

## 2018-12-11 DIAGNOSIS — I739 Peripheral vascular disease, unspecified: Secondary | ICD-10-CM

## 2018-12-11 MED ORDER — ROSUVASTATIN CALCIUM 20 MG PO TABS: 20.0000 mg | ORAL_TABLET | Freq: Every day | ORAL | 3 refills | Status: DC

## 2018-12-11 NOTE — Progress Notes (Addendum)
Subjective:   William Warner, male    DOB: [birth year], 73 y.o.   MRN: [id number]    Chief complaint:  Exertional dyspnea Possible coronary artery disease   73 y.o. Caucasian male with hypertension, stable AAA, aortic atherosclerosis, mixed hyperlipidemia, h/o tobacco abuse, referred for evaluation of exertional dyspnea and possible coronary artery disease.  His echocardiogram was unremarkable to explain episode of dyspnea, details below. Today, the patient tells me that back in Spring, he had episode of shortness of breath while working in the yard cutting the grass. He was wearing an N95 mask to avoid exposure to pollen at that time. He has not had any recurrence of similar episode since then. He cut grass as recently at last week and denies chest pain, shortness of breath, palpitations, leg edema, orthopnea, PND, TIA/syncope.   Past Medical History:  Diagnosis Date  . AAA (abdominal aortic aneurysm) ([place])   . Allergy   . B12 deficiency   . DJD (degenerative joint disease)   . DJD (degenerative joint disease)   . DOE (dyspnea on exertion)   . Erectile dysfunction   . GERD (gastroesophageal reflux disease)   . Hearing loss   . Hemorrhagic gastritis   . Hx of colonoscopy 2007  . Hyperlipidemia   . Hypertension   . Insomnia   . Lumbago   . Other B-complex deficiencies   . Peripheral vascular disease ([place])   . PVD (peripheral vascular disease) ([place])   . Renal calculi   . Vitamin D deficiency      Past Surgical History:  Procedure Laterality Date  . COLONOSCOPY    . KNEE SURGERY    . TONSILLECTOMY       Social History   Socioeconomic History  . Marital status: Married    Spouse name: Not on file  . Number of children: 3  . Years of education: Not on file  . Highest education level: Not on file  Occupational History  . Not on file  Social Needs  . Financial resource strain: Not on file  . Food insecurity    Worry: Not on file    Inability: Not on file   . Transportation needs    Medical: Not on file    Non-medical: Not on file  Tobacco Use  . Smoking status: Former Smoker    Packs/day: 1.00    Years: 54.00    Pack years: 54.00    Types: Cigarettes    Quit date: 05/17/2016    Years since quitting: 2.5  . Smokeless tobacco: Never Used  Substance and Sexual Activity  . Alcohol use: No    Alcohol/week: 0.0 standard drinks    Comment: Quit 2014, drank heavily before that   . Drug use: No  . Sexual activity: Not on file  Lifestyle  . Physical activity    Days per week: Not on file    Minutes per session: Not on file  . Stress: Not on file  Relationships  . Social Herbalist on phone: Not on file    Gets together: Not on file    Attends religious service: Not on file    Active member of club or organization: Not on file    Attends meetings of clubs or organizations: Not on file    Relationship status: Not on file  . Intimate partner violence    Fear of current or ex partner: Not on file    Emotionally abused: Not on file  Physically abused: Not on file    Forced sexual activity: Not on file  Other Topics Concern  . Not on file  Social History Narrative  . Not on file     Family History  Problem Relation Age of Onset  . Lung cancer Mother   . Peripheral vascular disease Father      Current Outpatient Medications on File Prior to Visit  Medication Sig Dispense Refill  . aspirin 81 MG tablet Take 81 mg by mouth daily.    Marland Kitchen atenolol (TENORMIN) 25 MG tablet Take 25 mg by mouth daily.    . Cholecalciferol (D3 DOTS) 50 MCG (2000 UT) TBDP Take 2,000 Units by mouth daily.    . clonazePAM (KLONOPIN) 2 MG tablet Take 2 mg by mouth daily.     Marland Kitchen EPINEPHrine (EPIPEN JR) 0.15 MG/0.3ML injection Inject 0.15 mg into the muscle as needed.    Marland Kitchen esomeprazole (NEXIUM) 40 MG capsule Take 40 mg by mouth daily before breakfast.    . felodipine (PLENDIL) 5 MG 24 hr tablet Take 5 mg by mouth daily.    . fluticasone (FLONASE) 50  MCG/ACT nasal spray Place 1-2 sprays into both nostrils daily.    Marland Kitchen gemfibrozil (LOPID) 600 MG tablet Take 600 mg by mouth daily.     . Multiple Vitamin tablet Take 1 tablet by mouth daily.    . nitroGLYCERIN (NITROSTAT) 0.4 MG SL tablet Place 1 tablet (0.4 mg total) under the tongue every 5 (five) minutes as needed for chest pain. 30 tablet 3  . simvastatin (ZOCOR) 20 MG tablet Take 20 mg by mouth every evening.    . tamsulosin (FLOMAX) 0.4 MG CAPS capsule Take 1 capsule (0.4 mg total) by mouth daily. 14 capsule 0   No current facility-administered medications on file prior to visit.     Cardiovascular studies:  EKG 12/11/2018: Sinus rhythm 62 bpm. Normal EKG.  Echocardiogram 11/06/2018: Normal LV systolic function with EF 68%. Left ventricle cavity is normal in size. Normal global wall motion. Doppler evidence of grade I (impaired) diastolic dysfunction, normal LAP. Calculated EF 68%. Mild (Grade I) mitral regurgitation. Normal right atrial pressure.  CT Chest 08/16/2017: 1. Lung-RADS 1, negative. Continue annual screening with low-dose chest CT without contrast in 12 months. 2. Aortic atherosclerosis (ICD10-170.0). Coronary artery calcification. 3.  Emphysema (ICD10-J43.9).  US abdomen 08/15/2017: 1. No acute findings. 2. Infrarenal abdominal aortic aneurysm measures 3.0 cm anterior-posterior by 3.5 cm right to left, without significant change from the prior CT allowing for the differences in imaging modalities, and measurement technique. Recommend followup by ultrasound in 3 years. This recommendation follows ACR consensus guidelines: White Paper of the ACR Incidental Findings Committee II on Vascular Findings. J Am Coll Radiol 2013; 50:932-671 3. Small nonobstructing stones in each kidney.  No hydronephrosis.  Recent labs: 08/02/2017: Glucose 97, BUN/Cr 9/0.87. eGFR 86. Na/K 142/4.5. Rest of the CMP normal.  H/H 14/44. MCV 92. Platelets 193.  Chol 201, TG 196, HDL 47,  LDL 115.   Review of Systems  Constitution: Negative for decreased appetite, malaise/fatigue, weight gain and weight loss.  HENT: Negative for congestion.   Eyes: Negative for visual disturbance.  Cardiovascular: Negative for chest pain, dyspnea on exertion, leg swelling, palpitations and syncope.  Respiratory: Negative for shortness of breath.   Endocrine: Negative for cold intolerance.  Hematologic/Lymphatic: Does not bruise/bleed easily.  Skin: Negative for itching and rash.  Musculoskeletal: Negative for myalgias.  Gastrointestinal: Negative for abdominal pain, nausea and vomiting.  Genitourinary: Negative for dysuria.  Neurological: Positive for numbness (In feet. Longstanding). Negative for dizziness and weakness.  Psychiatric/Behavioral: The patient is not nervous/anxious.   All other systems reviewed and are negative.       Vitals:   12/11/18 1451  BP: 137/70  Pulse: 67  SpO2: 95%    Objective:      Physical Exam  Constitutional: He is oriented to person, place, and time. He appears well-developed and well-nourished. No distress.  HENT:  Head: Normocephalic and atraumatic.  Eyes: Pupils are equal, round, and reactive to light. Conjunctivae are normal.  Neck: No JVD present.  Cardiovascular: Normal rate, regular rhythm and intact distal pulses.  Pulmonary/Chest: Effort normal and breath sounds normal. He has no wheezes. He has no rales.  Abdominal: Soft. Bowel sounds are normal. There is no rebound.  Musculoskeletal:        General: No edema.  Lymphadenopathy:    He has no cervical adenopathy.  Neurological: He is alert and oriented to person, place, and time. No cranial nerve deficit.  Skin: Skin is warm and dry.  Psychiatric: He has a normal mood and affect.  Nursing note and vitals reviewed.        Assessment & Recommendations:   73 y.o. Caucasian male with hypertension, stable AAA, aortic atherosclerosis, mixed hyperlipidemia, h/o tobacco abuse,  referred for evaluation of exertional dyspnea and possible coronary artery disease  DOE (dyspnea on exertion): Now completely resolved. Occasional episodes in early spring was possible related to wearing N95 mask during pollen season. While he does have coronary atherosclerosis, he has no symptoms suggestive of angina/ angina equivalent at this time. Recommend aggressive medical management at this time.  Continue aspirin 81 mg daily. Recommend switching from simvastatin to rosuvastatin. He is also on gemfibrozil. He has been on this for several years for hypertriglyceridemia, which is currently controlled. I explained to him regarding increased risk of myopathy with concurrent use of statins and gemfibrozil. He did not have significant side effect while he was taking simvastatin and gemfibrozil. Will monitor for any side effects with concurrent use of rosuvastatin and gemfibrozil.  AAA: This is followed by Dr. Inda Merlin' office. Patient tells me that he recently underwent annual [place] and was reportedly stable.  F/u in 3 months with repeat lipid panel.  Nigel Mormon, MD [facility] [place] Pager: [phone] Office: [phone] If no answer Cell [phone]

## 2018-12-12 ENCOUNTER — Encounter: Payer: Self-pay | Admitting: Cardiology

## 2018-12-15 DIAGNOSIS — E782 Mixed hyperlipidemia: Secondary | ICD-10-CM | POA: Diagnosis not present

## 2018-12-15 DIAGNOSIS — I10 Essential (primary) hypertension: Secondary | ICD-10-CM | POA: Diagnosis not present

## 2018-12-21 ENCOUNTER — Other Ambulatory Visit: Payer: Self-pay | Admitting: Cardiology

## 2018-12-21 DIAGNOSIS — E782 Mixed hyperlipidemia: Secondary | ICD-10-CM

## 2019-01-01 DIAGNOSIS — R0981 Nasal congestion: Secondary | ICD-10-CM | POA: Diagnosis not present

## 2019-01-01 DIAGNOSIS — R296 Repeated falls: Secondary | ICD-10-CM | POA: Diagnosis not present

## 2019-01-01 DIAGNOSIS — R55 Syncope and collapse: Secondary | ICD-10-CM | POA: Diagnosis not present

## 2019-01-03 ENCOUNTER — Other Ambulatory Visit: Payer: Self-pay

## 2019-01-03 ENCOUNTER — Encounter: Payer: Self-pay | Admitting: Neurology

## 2019-01-03 ENCOUNTER — Ambulatory Visit (INDEPENDENT_AMBULATORY_CARE_PROVIDER_SITE_OTHER): Payer: PPO | Admitting: Neurology

## 2019-01-03 VITALS — BP 138/75 | HR 57 | Temp 98.0°F | Ht 68.0 in | Wt 195.8 lb

## 2019-01-03 DIAGNOSIS — R7309 Other abnormal glucose: Secondary | ICD-10-CM

## 2019-01-03 DIAGNOSIS — G3281 Cerebellar ataxia in diseases classified elsewhere: Secondary | ICD-10-CM

## 2019-01-03 DIAGNOSIS — E538 Deficiency of other specified B group vitamins: Secondary | ICD-10-CM

## 2019-01-03 DIAGNOSIS — R292 Abnormal reflex: Secondary | ICD-10-CM | POA: Diagnosis not present

## 2019-01-03 DIAGNOSIS — R270 Ataxia, unspecified: Secondary | ICD-10-CM

## 2019-01-03 DIAGNOSIS — G1229 Other motor neuron disease: Secondary | ICD-10-CM

## 2019-01-03 DIAGNOSIS — R404 Transient alteration of awareness: Secondary | ICD-10-CM | POA: Diagnosis not present

## 2019-01-03 DIAGNOSIS — E531 Pyridoxine deficiency: Secondary | ICD-10-CM | POA: Diagnosis not present

## 2019-01-03 DIAGNOSIS — G5793 Unspecified mononeuropathy of bilateral lower limbs: Secondary | ICD-10-CM

## 2019-01-03 DIAGNOSIS — R200 Anesthesia of skin: Secondary | ICD-10-CM | POA: Diagnosis not present

## 2019-01-03 DIAGNOSIS — E519 Thiamine deficiency, unspecified: Secondary | ICD-10-CM | POA: Diagnosis not present

## 2019-01-03 DIAGNOSIS — G1221 Amyotrophic lateral sclerosis: Secondary | ICD-10-CM | POA: Diagnosis not present

## 2019-01-03 DIAGNOSIS — R55 Syncope and collapse: Secondary | ICD-10-CM | POA: Diagnosis not present

## 2019-01-03 NOTE — Patient Instructions (Addendum)
MRI of the brain and cervical spine Blood work today to look for causes of foot numbness EEG to look at brain waves   Fall Prevention in the Home, Adult Falls can cause injuries. They can happen to people of all ages. There are many things you can do to make your home safe and to help prevent falls. Ask for help when making these changes, if needed. What actions can I take to prevent falls? General Instructions  Use good lighting in all rooms. Replace any light bulbs that burn out.  Turn on the lights when you go into a dark area. Use night-lights.  Keep items that you use often in easy-to-reach places. Lower the shelves around your home if necessary.  Set up your furniture so you have a clear path. Avoid moving your furniture around.  Do not have throw rugs and other things on the floor that can make you trip.  Avoid walking on wet floors.  If any of your floors are uneven, fix them.  Add color or contrast paint or tape to clearly mark and help you see: ? Any grab bars or handrails. ? First and last steps of stairways. ? Where the edge of each step is.  If you use a stepladder: ? Make sure that it is fully opened. Do not climb a closed stepladder. ? Make sure that both sides of the stepladder are locked into place. ? Ask someone to hold the stepladder for you while you use it.  If there are any pets around you, be aware of where they are. What can I do in the bathroom?      Keep the floor dry. Clean up any water that spills onto the floor as soon as it happens.  Remove soap buildup in the tub or shower regularly.  Use non-skid mats or decals on the floor of the tub or shower.  Attach bath mats securely with double-sided, non-slip rug tape.  If you need to sit down in the shower, use a plastic, non-slip stool.  Install grab bars by the toilet and in the tub and shower. Do not use towel bars as grab bars. What can I do in the bedroom?  Make sure that you have a  light by your bed that is easy to reach.  Do not use any sheets or blankets that are too big for your bed. They should not hang down onto the floor.  Have a firm chair that has side arms. You can use this for support while you get dressed. What can I do in the kitchen?  Clean up any spills right away.  If you need to reach something above you, use a strong step stool that has a grab bar.  Keep electrical cords out of the way.  Do not use floor polish or wax that makes floors slippery. If you must use wax, use non-skid floor wax. What can I do with my stairs?  Do not leave any items on the stairs.  Make sure that you have a light switch at the top of the stairs and the bottom of the stairs. If you do not have them, ask someone to add them for you.  Make sure that there are handrails on both sides of the stairs, and use them. Fix handrails that are broken or loose. Make sure that handrails are as long as the stairways.  Install non-slip stair treads on all stairs in your home.  Avoid having throw rugs at the top  or bottom of the stairs. If you do have throw rugs, attach them to the floor with carpet tape.  Choose a carpet that does not hide the edge of the steps on the stairway.  Check any carpeting to make sure that it is firmly attached to the stairs. Fix any carpet that is loose or worn. What can I do on the outside of my home?  Use bright outdoor lighting.  Regularly fix the edges of walkways and driveways and fix any cracks.  Remove anything that might make you trip as you walk through a door, such as a raised step or threshold.  Trim any bushes or trees on the path to your home.  Regularly check to see if handrails are loose or broken. Make sure that both sides of any steps have handrails.  Install guardrails along the edges of any raised decks and porches.  Clear walking paths of anything that might make someone trip, such as tools or rocks.  Have any leaves, snow,  or ice cleared regularly.  Use sand or salt on walking paths during winter.  Clean up any spills in your garage right away. This includes grease or oil spills. What other actions can I take?  Wear shoes that: ? Have a low heel. Do not wear high heels. ? Have rubber bottoms. ? Are comfortable and fit you well. ? Are closed at the toe. Do not wear open-toe sandals.  Use tools that help you move around (mobility aids) if they are needed. These include: ? Canes. ? Walkers. ? Scooters. ? Crutches.  Review your medicines with your doctor. Some medicines can make you feel dizzy. This can increase your chance of falling. Ask your doctor what other things you can do to help prevent falls. Where to find more information  Centers for Disease Control and Prevention, STEADI: https://garcia.biz/  [employer] on Aging: BrainJudge.co.uk Contact a doctor if:  You are afraid of falling at home.  You feel weak, drowsy, or dizzy at home.  You fall at home. Summary  There are many simple things that you can do to make your home safe and to help prevent falls.  Ways to make your home safe include removing tripping hazards and installing grab bars in the bathroom.  Ask for help when making these changes in your home. This information is not intended to replace advice given to you by your health care provider. Make sure you discuss any questions you have with your health care provider. Document Released: 02/27/2009 Document Revised: 08/24/2018 Document Reviewed: 12/16/2016 Elsevier Patient Education  [place].    Electroencephalogram, Adult An electroencephalogram (EEG) is a test that records electrical activity in the brain. It is often used to diagnose or monitor problems that are related to the brain, such as:  Seizure disorders.  Sleeping problems.  Changes in behavior.  Head injuries.  Fainting spells. Tell a health care provider about:  Any allergies  you have.  All medicines you are taking, including vitamins, herbs, eye drops, creams, and over-the-counter medicines.  Any problems you or family members have had with anesthetic medicines.  Any blood disorders you have.  Any surgeries you have had.  Any medical conditions you have or have had, including psychiatric conditions.  Any history of illegal drug use or alcohol abuse. What are the risks? Generally, this is a safe test. If you have a seizure disorder, you may be made to have a seizure during the test. This is done so  that your brain activity can be recorded during the seizure. What happens before the procedure?   Arrive with your hair clean and dry. Do not comb your hair toward your scalp to add volume (backcomb), and do not put hair spray, oil, or other hair products in your hair.  Do not have caffeine within 4 hours of having your test.  Follow instructions from your health care provider about: ? Other eating or drinking restrictions. ? Sleeping before the test.  Ask your health care provider about taking your regular and over-the-counter medicines, herbs, and supplements. What happens during the procedure? You will be asked to sit in a chair or lie down. Small metal discs (electrodes) will be attached to your head with an adhesive. These electrodes will pick up on the signals in your brain, and a machine will record the signals. During the test, you may be asked to:  Sit or lie quietly and relax.  Open and close your eyes.  Breathe deeply and rapidly for 3 minutes or longer.  Look at a flashing light for a short period of time.  Read or look at an image.  Go to sleep. When the test is complete, the electrodes will be removed by using a solution such as acetone. What happens after the procedure? It is up to you to get the results of your procedure. Ask your health care provider, or the department that is doing the procedure, when your results will be  ready. Summary  An electroencephalogram (EEG) is a test that is often used to diagnose or monitor problems that are related to the brain.  Do not have caffeine within 4 hours of having your test. Follow other instructions from your health care provider about eating and drinking before the test.  During the procedure, small metal discs (electrodes) will be attached to your head with an adhesive.  During the test, you may be asked to sit or lie quietly and relax. You may also be asked to do other activities during the test. This information is not intended to replace advice given to you by your health care provider. Make sure you discuss any questions you have with your health care provider. Document Released: 04/30/2000 Document Revised: 03/07/2018 Document Reviewed: 03/07/2018 Elsevier Patient Education  2020 [employer].

## 2019-01-03 NOTE — Progress Notes (Signed)
[facility]    Provider:  Dr Jaynee Eagles Requesting Provider: Josetta Huddle, MD Primary Care Provider:  Josetta Huddle, MD  CC:  Falls  HPI:  William Warner is a 73 y.o. male here as requested by Josetta Huddle, MD for syncope.PMHx PVD, b-deficiencies, insomnia, HTN, HLD, ED, DOE, DJD, AAA, CAD, Syncope, former smoker.  Symptoms started the week of July 13, he was fixing pet's bowl, he started walking an dthe next thing he knew he was falling and he fell 6 feet from where he was, wife runs in because she heard it, he thought he stumbled on cabinet door that was open. July 27th he had another fall on the way to see his cardiologist, he went down 3 steps and when he gets to the bottom he stumbles and goes across the sidewalk to the yard trying to catch himself. He had a lot of bruising. At this fall, he was on the way to the cardiologist to follow up of shortness of breath. August 6th he was in a parking lot and he went down like a rock after misjudging the steps and fell. Wife saw him and says he "went down" and he was trying to catch himself and then he realized he didn;t remember tripping but does remember trying to catch himself and hitting the floor, no loss of consciousness on the floor or urination ir defecation or tongue biting, maybe a second or two of loss of awareness. He denies weakness. He feels imbalanced. He tends to get his toe caught. He has been having headaches mostly when he awakens, his nose has been running clear liquid like a "sieve". He snores, he does not wake up with dry mouth. He has neck crepitus in the neck. For 30-40 years he had 6 drinks a day, he smoked for 55 years 1 pack per day. His feet are numb to the ankles for 20+ years stable. Denies dizziness, orthostatic symptoms, weakness, head trauma, recent injury, changes in low back pain or bowel/bladder changes. Endorses DOE, SOB. No other focal neurologic deficits, associated symptoms, inciting events or  modifiable factors.  Reviewed notes, labs and imaging from outside physicians, which showed:  CT Head 2018: showed No acute intracranial abnormalities including mass lesion or mass effect, hydrocephalus, extra-axial fluid collection, midline shift, hemorrhage, or acute infarction, large ischemic events. Atrophy and white matter changes present. (personally reviewed images)  MI lumbar spine 2012: reviewed report: IMPRESSION: 1.  Acute and chronic endplate degeneration at L2-L3 associated with severe chronic disc degeneration. 2.  Chronic mild spondylolisthesis at L3-L4 and L5-S1. 3.  Subsequent multifactorial severe spinal stenosis at: - L3-L4 associated with moderate left foraminal stenosis. - L4-L5 associated with severe left foraminal stenosis. - L5-S1 associated with severe bilateral foraminal stenosis. 4.  Moderate multifactorial spinal stenosis at L2-L3 with mild to moderate right foraminal stenosis. 5.  Stable mild infrarenal abdominal aortic aneurysm/ectasia (maximum diameter 31 mm).  Review of Systems: Patient complains of symptoms per HPI as well as the following symptoms: falls. Pertinent negatives and positives per HPI. All others negative.   Social History   Socioeconomic History   Marital status: Married    Spouse name: Not on file   Number of children: 3   Years of education: Not on file   Highest education level: Not on file  Occupational History   Not on file  Social Needs   Financial resource strain: Not on file   Food insecurity    Worry: Not on file  Inability: Not on file   Transportation needs    Medical: Not on file    Non-medical: Not on file  Tobacco Use   Smoking status: Former Smoker    Packs/day: 1.00    Years: 54.00    Pack years: 54.00    Types: Cigarettes    Quit date: 05/17/2016    Years since quitting: 2.6   Smokeless tobacco: Never Used  Substance and Sexual Activity   Alcohol use: No    Alcohol/week: 0.0 standard drinks      Comment: Quit 2014, drank heavily before that    Drug use: No   Sexual activity: Not on file  Lifestyle   Physical activity    Days per week: Not on file    Minutes per session: Not on file   Stress: Not on file  Relationships   Social connections    Talks on phone: Not on file    Gets together: Not on file    Attends religious service: Not on file    Active member of club or organization: Not on file    Attends meetings of clubs or organizations: Not on file    Relationship status: Not on file   Intimate partner violence    Fear of current or ex partner: Not on file    Emotionally abused: Not on file    Physically abused: Not on file    Forced sexual activity: Not on file  Other Topics Concern   Not on file  Social History Narrative   Not on file    Family History  Problem Relation Age of Onset   Lung cancer Mother    Peripheral vascular disease Father     Past Medical History:  Diagnosis Date   AAA (abdominal aortic aneurysm) ([place])    Allergy    B12 deficiency    DJD (degenerative joint disease)    DJD (degenerative joint disease)    DOE (dyspnea on exertion)    Erectile dysfunction    GERD (gastroesophageal reflux disease)    Hearing loss    Hemorrhagic gastritis    Hx of colonoscopy 2007   Hyperlipidemia    Hypertension    Insomnia    Lumbago    Other B-complex deficiencies    Peripheral vascular disease ([place])    PVD (peripheral vascular disease) ([place])    Renal calculi    Vitamin D deficiency     Patient Active Problem List   Diagnosis Date Noted   Syncope and collapse 01/04/2019   Former smoker 08/29/2018   Coronary artery disease involving native coronary artery of native heart without angina pectoris 08/29/2018   Mixed hyperlipidemia 08/29/2018   DOE (dyspnea on exertion)    PVD (peripheral vascular disease) ([place])    DJD (degenerative joint disease)    RUQ abdominal pain 01/06/2012    Past Surgical  History:  Procedure Laterality Date   COLONOSCOPY     KNEE SURGERY     TONSILLECTOMY      Current Outpatient Medications  Medication Sig Dispense Refill   aspirin 81 MG tablet Take 81 mg by mouth daily.     atenolol (TENORMIN) 25 MG tablet Take 25 mg by mouth daily.     Cholecalciferol (D3 DOTS) 50 MCG (2000 UT) TBDP Take 2,000 Units by mouth daily.     clonazePAM (KLONOPIN) 2 MG tablet Take 2 mg by mouth at bedtime.      EPINEPHrine (EPIPEN JR) 0.15 MG/0.3ML injection Inject 0.15 mg into  the muscle as needed.     esomeprazole (NEXIUM) 40 MG capsule Take 40 mg by mouth daily before breakfast.     felodipine (PLENDIL) 5 MG 24 hr tablet Take 5 mg by mouth daily.     fluticasone (FLONASE) 50 MCG/ACT nasal spray Place 1-2 sprays into both nostrils daily.     gemfibrozil (LOPID) 600 MG tablet Take 600 mg by mouth daily.      Multiple Vitamin tablet Take 1 tablet by mouth daily.     nitroGLYCERIN (NITROSTAT) 0.4 MG SL tablet Place 1 tablet (0.4 mg total) under the tongue every 5 (five) minutes as needed for chest pain. 30 tablet 3   rosuvastatin (CRESTOR) 20 MG tablet TAKE 1 TABLET (20 MG TOTAL) BY MOUTH DAILY. 30 tablet 2   tamsulosin (FLOMAX) 0.4 MG CAPS capsule Take 1 capsule (0.4 mg total) by mouth daily. 14 capsule 0   No current facility-administered medications for this visit.     Allergies as of 01/03/2019 - Review Complete 01/03/2019  Allergen Reaction Noted   Bee venom Swelling 01/06/2012   Augmentin [amoxicillin-pot clavulanate] Diarrhea 08/29/2018    Vitals: BP 138/75    Pulse (!) 57    Temp 98 F (36.7 C)    Ht _0  (1.727 m)    Wt 195 lb 12.8 oz (88.8 kg)    BMI 29.77 kg/m  Last Weight:  Wt Readings from Last 1 Encounters:  01/04/19 195 lb (88.5 kg)   Last Height:   Ht Readings from Last 1 Encounters:  01/04/19 _1  (1.778 m)     Physical exam: Exam: slightly tangential  Gen: NAD, tangential, overweight                CV: RRR, no MRG. No  Carotid Bruits. No peripheral edema, warm, nontender Eyes: Conjunctivae clear without exudates or hemorrhage  Neuro: Detailed Neurologic Exam  Speech:    Speech is normal; fluent and spontaneous with normal comprehension.  Cognition:    The patient is oriented to person, place, and time;     recent and remote memory intact;     language fluent;     Impaired attention, concentration.    fund of knowledge intact Cranial Nerves: Hypomimia    The pupils are equal, round, and reactive to light. Attempted fundoscopic exam could not visualize due to small pupils.  Visual fields are full to finger confrontation. Extraocular movements are intact. Trigeminal sensation is intact and the muscles of mastication are normal. The face is symmetric. The palate elevates in the midline. Hearing intact. Voice is hypophonic. Shoulder shrug is normal. The tongue has normal motion without fasciculations.   Coordination:    FTN, HTS normal  Gait:    Good arm swing, not shuffling, good clearance. Heel-to-toe with some imbalance  Motor Observation:    No asymmetry, no atrophy, and no involuntary movements noted. Tone:    Normal muscle tone.    Posture:    Posture is normal. normal erect    Strength: 4+/5 left hip flexion, otherwise strength is V/V in the upper and lower limbs.      Sensation: decreased distally to pin prick and vibration.     Reflex Exam:  DTR's:    Deep tendon reflexes in the upper and lower extremities are brisk bilaterally.   Toes:    The toes are downgoing bilaterally.   Clonus:    2 beats clonus AJs    Assessment/Plan:   73 y.o. male here as  requested by Josetta Huddle, MD for syncope .PMHx PVD, b-deficiencies, insomnia, HTN, HLD, ED, DOE, DJD, AAA, CAD, Syncope, former smoker, former alcohol abuse.   Syncope?: Patient denies alteration of awareness,says there is possible  loss of consciousness as he doesn't remember why he falls, he doesn't remember the inciting event of  the fall but he remembers the fall and trying to protect himself when hitting the floor. May be multifactorial such as  Mechanical, due to imbalance, DJD, multi-level severe spinal stenosis (MRI L-spine 2012),  neuropathy or may be cardiac in nature, less likely seizure, denies vertigo or symptoms/signs of positional lightheadedness or vertebrobasilar insufficiency but will order MRI brain for stoke or other etiology and MRA head for vascular causes. MRI cervical spine for ataxia and falls. He denies any changes in low back symptoms (chroic lumbago and severe spinal stenosis in past). EEG ordered. He is being evaluated by cardiology as well.   MRI lumbar spine 2012 with severe multi-level spinal stenosis:   Morning headaches: he snores. Declines sleep eval.   Falls: mri brain and cervical spine to evaluate for stroke, cervical myelopathy or other upper motor neuron lesions given his brisk reflexes and 2 beats clonus at AJs.   Consider holter monitor: he has appointment with cardiology  Neuropathy: Heavy drinker and smoker for many years, Hx of B12 deficiency, , likely etiology but will check other neuropathy labs today  Recommend PT: declines. Discussed fall precautions.   Decide follow up after workup   Orders Placed This Encounter  Procedures   MR BRAIN W WO CONTRAST   MR CERVICAL SPINE WO CONTRAST   MR ANGIO HEAD WO CONTRAST   Hemoglobin A1c   B12 and Folate Panel   Methylmalonic acid, serum   Vitamin B1   Vitamin B6   Heavy metals, blood   Multiple Myeloma Panel (SPEP&IFE w/QIG)   Basic Metabolic Panel   EEG   Cc: Josetta Huddle, MD,    Sarina Ill, MD  [facility] [address] [place], [place] [address]  Phone [phone] Fax [phone]

## 2019-01-04 ENCOUNTER — Encounter: Payer: Self-pay | Admitting: Cardiology

## 2019-01-04 ENCOUNTER — Other Ambulatory Visit: Payer: Self-pay

## 2019-01-04 ENCOUNTER — Ambulatory Visit: Payer: PPO

## 2019-01-04 ENCOUNTER — Ambulatory Visit (INDEPENDENT_AMBULATORY_CARE_PROVIDER_SITE_OTHER): Payer: PPO | Admitting: Cardiology

## 2019-01-04 VITALS — BP 130/60 | HR 54 | Temp 98.4°F | Ht 70.0 in | Wt 195.0 lb

## 2019-01-04 DIAGNOSIS — I2510 Atherosclerotic heart disease of native coronary artery without angina pectoris: Secondary | ICD-10-CM

## 2019-01-04 DIAGNOSIS — R55 Syncope and collapse: Secondary | ICD-10-CM | POA: Diagnosis not present

## 2019-01-04 NOTE — Progress Notes (Signed)
Subjective:   William Warner, male    DOB: 10/26/1945, 73 y.o.   MRN: [id number]   Chief complaint:  Loss of consciousness  73 y.o. Caucasian male with hypertension, stable AAA, aortic atherosclerosis, mixed hyperlipidemia, h/o tobacco abuse.  Patient was previously seen for shortness of breath with unremarkable echocardiogram. He subsequently had improvement in shortness of breath symptoms.   In last few weeks, he has had at least three episodes of transient loss of consciousness. He remembers trying to catch his balance while falling, but does not recollect how he lost consciousness. With one episode, he had bruises on his forearm and abdomen. Fortunately, he has not had any head injury. He denies any chest pain, shortness of breath, palpitations, prior to these episodes. Patient was also seen by Neurologist Dr. Jaynee Eagles, with workup ongoing.    Past Medical History:  Diagnosis Date  . AAA (abdominal aortic aneurysm) ([place])   . Allergy   . B12 deficiency   . DJD (degenerative joint disease)   . DJD (degenerative joint disease)   . DOE (dyspnea on exertion)   . Erectile dysfunction   . GERD (gastroesophageal reflux disease)   . Hearing loss   . Hemorrhagic gastritis   . Hx of colonoscopy 2007  . Hyperlipidemia   . Hypertension   . Insomnia   . Lumbago   . Other B-complex deficiencies   . Peripheral vascular disease ([place])   . PVD (peripheral vascular disease) ([place])   . Renal calculi   . Vitamin D deficiency      Past Surgical History:  Procedure Laterality Date  . COLONOSCOPY    . KNEE SURGERY    . TONSILLECTOMY       Social History   Socioeconomic History  . Marital status: Married    Spouse name: Not on file  . Number of children: 3  . Years of education: Not on file  . Highest education level: Not on file  Occupational History  . Not on file  Social Needs  . Financial resource strain: Not on file  . Food insecurity    Worry: Not on file    Inability:  Not on file  . Transportation needs    Medical: Not on file    Non-medical: Not on file  Tobacco Use  . Smoking status: Former Smoker    Packs/day: 1.00    Years: 54.00    Pack years: 54.00    Types: Cigarettes    Quit date: 05/17/2016    Years since quitting: 2.6  . Smokeless tobacco: Never Used  Substance and Sexual Activity  . Alcohol use: No    Alcohol/week: 0.0 standard drinks    Comment: Quit 2014, drank heavily before that   . Drug use: No  . Sexual activity: Not on file  Lifestyle  . Physical activity    Days per week: Not on file    Minutes per session: Not on file  . Stress: Not on file  Relationships  . Social Herbalist on phone: Not on file    Gets together: Not on file    Attends religious service: Not on file    Active member of club or organization: Not on file    Attends meetings of clubs or organizations: Not on file    Relationship status: Not on file  . Intimate partner violence    Fear of current or ex partner: Not on file    Emotionally abused: Not on  file    Physically abused: Not on file    Forced sexual activity: Not on file  Other Topics Concern  . Not on file  Social History Narrative  . Not on file     Family History  Problem Relation Age of Onset  . Lung cancer Mother   . Peripheral vascular disease Father      Current Outpatient Medications on File Prior to Visit  Medication Sig Dispense Refill  . aspirin 81 MG tablet Take 81 mg by mouth daily.    Marland Kitchen atenolol (TENORMIN) 25 MG tablet Take 25 mg by mouth daily.    . Cholecalciferol (D3 DOTS) 50 MCG (2000 UT) TBDP Take 2,000 Units by mouth daily.    . clonazePAM (KLONOPIN) 2 MG tablet Take 2 mg by mouth at bedtime.     Marland Kitchen EPINEPHrine (EPIPEN JR) 0.15 MG/0.3ML injection Inject 0.15 mg into the muscle as needed.    Marland Kitchen esomeprazole (NEXIUM) 40 MG capsule Take 40 mg by mouth daily before breakfast.    . felodipine (PLENDIL) 5 MG 24 hr tablet Take 5 mg by mouth daily.    .  fluticasone (FLONASE) 50 MCG/ACT nasal spray Place 1-2 sprays into both nostrils daily.    Marland Kitchen gemfibrozil (LOPID) 600 MG tablet Take 600 mg by mouth daily.     . Multiple Vitamin tablet Take 1 tablet by mouth daily.    . nitroGLYCERIN (NITROSTAT) 0.4 MG SL tablet Place 1 tablet (0.4 mg total) under the tongue every 5 (five) minutes as needed for chest pain. 30 tablet 3  . rosuvastatin (CRESTOR) 20 MG tablet TAKE 1 TABLET (20 MG TOTAL) BY MOUTH DAILY. 30 tablet 2  . tamsulosin (FLOMAX) 0.4 MG CAPS capsule Take 1 capsule (0.4 mg total) by mouth daily. 14 capsule 0   No current facility-administered medications on file prior to visit.     Cardiovascular studies:  EKG 01/04/2019: Sinus rhythm 51 bpm. Low voltage limb leads. Compared to previus EKG, rate is slower.  EKG 12/11/2018: Sinus rhythm 62 bpm. Normal EKG.  Echocardiogram 11/06/2018: Normal LV systolic function with EF 68%. Left ventricle cavity is normal in size. Normal global wall motion. Doppler evidence of grade I (impaired) diastolic dysfunction, normal LAP. Calculated EF 68%. Mild (Grade I) mitral regurgitation. Normal right atrial pressure.  CT Chest 08/16/2017: 1. Lung-RADS 1, negative. Continue annual screening with low-dose chest CT without contrast in 12 months. 2. Aortic atherosclerosis (ICD10-170.0). Coronary artery calcification. 3.  Emphysema (ICD10-J43.9).  US abdomen 08/15/2017: 1. No acute findings. 2. Infrarenal abdominal aortic aneurysm measures 3.0 cm anterior-posterior by 3.5 cm right to left, without significant change from the prior CT allowing for the differences in imaging modalities, and measurement technique. Recommend followup by ultrasound in 3 years. This recommendation follows ACR consensus guidelines: White Paper of the ACR Incidental Findings Committee II on Vascular Findings. J Am Coll Radiol 2013; 86:767-209 3. Small nonobstructing stones in each kidney.  No hydronephrosis.  Recent  labs: 08/02/2017: Glucose 97, BUN/Cr 9/0.87. eGFR 86. Na/K 142/4.5. Rest of the CMP normal.  H/H 14/44. MCV 92. Platelets 193.  Chol 201, TG 196, HDL 47, LDL 115.   Review of Systems  Constitution: Negative for decreased appetite, malaise/fatigue, weight gain and weight loss.  HENT: Negative for congestion.   Eyes: Negative for visual disturbance.  Cardiovascular: Negative for chest pain, dyspnea on exertion, leg swelling, palpitations and syncope.  Respiratory: Negative for shortness of breath.   Endocrine: Negative for cold intolerance.  Hematologic/Lymphatic: Does not bruise/bleed easily.  Skin: Negative for itching and rash.  Musculoskeletal: Negative for myalgias.  Gastrointestinal: Negative for abdominal pain, nausea and vomiting.  Genitourinary: Negative for dysuria.  Neurological: Positive for numbness (In feet. Longstanding). Negative for dizziness and weakness.  Psychiatric/Behavioral: The patient is not nervous/anxious.   All other systems reviewed and are negative.       Vitals:   01/04/19 1511  BP: 130/60  Pulse: (!) 54  Temp: 98.4 F (36.9 C)  SpO2: 98%    Objective:      Physical Exam  Constitutional: He is oriented to person, place, and time. He appears well-developed and well-nourished. No distress.  HENT:  Head: Normocephalic and atraumatic.  Eyes: Pupils are equal, round, and reactive to light. Conjunctivae are normal.  Neck: No JVD present.  Cardiovascular: Normal rate, regular rhythm and intact distal pulses.  Pulmonary/Chest: Effort normal and breath sounds normal. He has no wheezes. He has no rales.  Abdominal: Soft. Bowel sounds are normal. There is no rebound.  Musculoskeletal:        General: No edema.  Lymphadenopathy:    He has no cervical adenopathy.  Neurological: He is alert and oriented to person, place, and time. No cranial nerve deficit.  Skin: Skin is warm and dry.  Psychiatric: He has a normal mood and affect.  Nursing note and  vitals reviewed.        Assessment & Recommendations:   73 y.o. Caucasian male with hypertension, stable AAA, aortic atherosclerosis, mixed hyperlipidemia, h/o tobacco abuse, now with syncopal episodes.  Syncope: Episodes concerning for arrhythmia. Resting EKG is normal. Echocardiogram shows structurally normal heart. Low suspicion for ischemia. Recommend 4 week event monitor. Continue workup with Neurology.    Primary cardiac prevention: Given risk factors for CAD and coronary calcification, continue aspirin 81 mg daily. He is tolerating rosuvastatin and gemfibrozil without any side effects.  AAA: This is followed by Dr. Inda Merlin' office. Patient tells me that he recently underwent annual [place] and was reportedly stable.  F/u in after 4 weeks event monitor.   Nigel Mormon, MD [facility] Pager: [phone] Office: [phone] If no answer Cell [phone]

## 2019-01-06 ENCOUNTER — Encounter: Payer: Self-pay | Admitting: Neurology

## 2019-01-07 ENCOUNTER — Telehealth: Payer: Self-pay | Admitting: Neurology

## 2019-01-07 LAB — VITAMIN B1: Thiamine: 125.7 nmol/L (ref 66.5–200.0)

## 2019-01-07 LAB — MULTIPLE MYELOMA PANEL, SERUM
Albumin SerPl Elph-Mcnc: 4 g/dL (ref 2.9–4.4)
Albumin/Glob SerPl: 1.4 (ref 0.7–1.7)
Alpha 1: 0.2 g/dL (ref 0.0–0.4)
Alpha2 Glob SerPl Elph-Mcnc: 0.7 g/dL (ref 0.4–1.0)
B-Globulin SerPl Elph-Mcnc: 1.1 g/dL (ref 0.7–1.3)
Gamma Glob SerPl Elph-Mcnc: 0.9 g/dL (ref 0.4–1.8)
Globulin, Total: 2.9 g/dL (ref 2.2–3.9)
IgA/Immunoglobulin A, Serum: 314 mg/dL (ref 61–437)
IgG (Immunoglobin G), Serum: 895 mg/dL (ref 603–1613)
IgM (Immunoglobulin M), Srm: 181 mg/dL — ABNORMAL HIGH (ref 15–143)
Total Protein: 6.9 g/dL (ref 6.0–8.5)

## 2019-01-07 LAB — BASIC METABOLIC PANEL
BUN/Creatinine Ratio: 11 (ref 10–24)
BUN: 11 mg/dL (ref 8–27)
CO2: 24 mmol/L (ref 20–29)
Calcium: 9.3 mg/dL (ref 8.6–10.2)
Chloride: 102 mmol/L (ref 96–106)
Creatinine, Ser: 0.96 mg/dL (ref 0.76–1.27)
GFR calc Af Amer: 90 mL/min/{1.73_m2} (ref >59)
GFR calc non Af Amer: 78 mL/min/{1.73_m2} (ref >59)
Glucose: 108 mg/dL — ABNORMAL HIGH (ref 65–99)
Potassium: 5.2 mmol/L (ref 3.5–5.2)
Sodium: 142 mmol/L (ref 134–144)

## 2019-01-07 LAB — B12 AND FOLATE PANEL
Folate: >20 ng/mL (ref >3.0)
Vitamin B-12: 730 pg/mL (ref 232–1245)

## 2019-01-07 LAB — HEMOGLOBIN A1C
Est. average glucose Bld gHb Est-mCnc: 117 mg/dL
Hgb A1c MFr Bld: 5.7 % — ABNORMAL HIGH (ref 4.8–5.6)

## 2019-01-07 LAB — HEAVY METALS, BLOOD
Arsenic: 5 ug/L (ref 2–23)
Lead, Blood: NOT DETECTED ug/dL (ref 0–4)
Mercury: NOT DETECTED ug/L (ref 0.0–14.9)

## 2019-01-07 LAB — METHYLMALONIC ACID, SERUM: Methylmalonic Acid: 103 nmol/L (ref 0–378)

## 2019-01-07 LAB — VITAMIN B6: Vitamin B6: 50.4 ug/L — ABNORMAL HIGH (ref 5.3–46.7)

## 2019-01-07 NOTE — Telephone Encounter (Signed)
Bethany please call patient: Labs unremarkable. Slight increase in HgbA1c just slighlyt pre-diabetic. BMP with elevated glucose. Please discuss with pcp at next appointment not urgent. May want to examine diet and decrease carbs and sugary items. B12, B6, Thiamine, and other labs unremarkable. These labs performed for neuropathy which I think could be due to his past history of alcohol abuse and smoking and, per chart, he may have had a B12 deficiency in the past. Pending MRi cervical spine and brain thanks.   thanks

## 2019-01-08 ENCOUNTER — Telehealth: Payer: Self-pay | Admitting: Neurology

## 2019-01-08 NOTE — Telephone Encounter (Signed)
health team order sent to GI. No auth they will reach out to the patient to schedule.  

## 2019-01-08 NOTE — Telephone Encounter (Signed)
Spoke with the pt and discussed Dr. Cathren Laine message regarding lab results. He verbalized understanding. He stated he has a heart monitor for 30 days scheduled to be d/c'd around 02/04/2019. He doesn't believe he can have the MRI with the monitor. I advised him to check with MRI and the cardiologist. Pt had no questions. Verbalized appreciation for the call.

## 2019-01-10 DIAGNOSIS — I10 Essential (primary) hypertension: Secondary | ICD-10-CM | POA: Diagnosis not present

## 2019-01-10 DIAGNOSIS — E782 Mixed hyperlipidemia: Secondary | ICD-10-CM | POA: Diagnosis not present

## 2019-01-23 ENCOUNTER — Telehealth: Payer: Self-pay

## 2019-01-23 NOTE — Telephone Encounter (Signed)
Patient has some questions about his upcoming EEG and would like to speak to the nurse.

## 2019-01-24 NOTE — Telephone Encounter (Signed)
Spoke with sleep lab. I called pt back and advised him to try to wake up no later than 9 AM on the day of the EEG. He verbalized appreciation and understanding.

## 2019-01-24 NOTE — Telephone Encounter (Signed)
I spoke with the patient. He asked if he can wear his heart monitor that cardiology gave him. He also asked if its a problem that he usually wakes up at 11:30 in the mornings. I advised he was fine to wear the heart monitor during the EEG and that I would ask about the sleeping late. I advised I would call back.

## 2019-01-31 ENCOUNTER — Ambulatory Visit: Payer: PPO | Admitting: Neurology

## 2019-01-31 ENCOUNTER — Other Ambulatory Visit: Payer: Self-pay

## 2019-01-31 DIAGNOSIS — R55 Syncope and collapse: Secondary | ICD-10-CM | POA: Diagnosis not present

## 2019-01-31 NOTE — Procedures (Signed)
    History:  William Warner is a 73 year old gentleman with episodic falls, he indicates that there may be a second or two of loss of awareness associated with the episodes.  The patient is being evaluated for these episodes.  This is a routine EEG.  No skull defects are noted.  Medications include aspirin, Tenormin, vitamin D supplementation, clonazepam, Nexium, Plendil, Flonase, Lopid, multivitamins, nitroglycerin, Crestor, and Flomax.  EEG classification: Normal awake  Description of the recording: The background rhythms of this recording consists of a fairly well modulated medium amplitude alpha rhythm of 10 Hz that is reactive to eye opening and closure. As the record progresses, the patient appears to remain in the waking state throughout the recording. Photic stimulation was performed, resulting in a bilateral and symmetric photic driving response. Hyperventilation was also performed, resulting in a minimal buildup of the background rhythm activities without significant slowing seen. At no time during the recording does there appear to be evidence of spike or spike wave discharges or evidence of focal slowing. EKG monitor shows no evidence of cardiac rhythm abnormalities with a heart rate of 60.  Impression: This is a normal EEG recording in the waking state. No evidence of ictal or interictal discharges are seen.

## 2019-01-31 NOTE — Progress Notes (Signed)
Please let patient know eeg is normal. No evidence for seizures or any abnormal brain activity. Great news, thanks

## 2019-02-01 ENCOUNTER — Telehealth: Payer: Self-pay | Admitting: *Deleted

## 2019-02-01 NOTE — Telephone Encounter (Addendum)
Spoke with pt. He understands EEG normal, no evidence for seizures or any abnormal brain activity. Pt verbalized appreciation. He stated he saw this on mychart as well.   ----- Message from Melvenia Beam, MD sent at 01/31/2019  9:07 PM EDT -----  Please let patient know eeg is normal. No evidence for seizures or any abnormal brain activity. Great news, thanks

## 2019-02-01 NOTE — Progress Notes (Signed)
Spoke with pt. See phone note. 

## 2019-02-06 ENCOUNTER — Ambulatory Visit
Admission: RE | Admit: 2019-02-06 | Discharge: 2019-02-06 | Disposition: A | Discharge status: Home / Self Care | Payer: PPO | Source: Ambulatory Visit | Attending: Neurology | Admitting: Neurology

## 2019-02-06 ENCOUNTER — Other Ambulatory Visit: Payer: Self-pay

## 2019-02-06 DIAGNOSIS — G3281 Cerebellar ataxia in diseases classified elsewhere: Secondary | ICD-10-CM | POA: Diagnosis not present

## 2019-02-06 DIAGNOSIS — R404 Transient alteration of awareness: Secondary | ICD-10-CM

## 2019-02-06 DIAGNOSIS — R55 Syncope and collapse: Secondary | ICD-10-CM

## 2019-02-06 DIAGNOSIS — R292 Abnormal reflex: Secondary | ICD-10-CM | POA: Diagnosis not present

## 2019-02-06 DIAGNOSIS — G1229 Other motor neuron disease: Secondary | ICD-10-CM

## 2019-02-06 DIAGNOSIS — R270 Ataxia, unspecified: Secondary | ICD-10-CM

## 2019-02-06 MED ORDER — GADOBENATE DIMEGLUMINE 529 MG/ML IV SOLN
18.0000 mL | Freq: Once | INTRAVENOUS | Status: AC | PRN
  Administered 2019-02-06: 18 mL via INTRAVENOUS

## 2019-02-11 NOTE — Progress Notes (Signed)
Subjective:   William Warner, male    DOB: [birth year], 73 y.o.   MRN: [id number]  I connected with the patient on 02/21/2019 by a telephone call and verified that I am speaking with the correct person using two identifiers.     I offered the patient a video enabled application for a virtual visit. Unfortunately, this could not be accomplished due to technical difficulties/lack of video enabled phone/computer. I discussed the limitations of evaluation and management by telemedicine and the availability of in person appointments. The patient expressed understanding and agreed to proceed.   This visit type was conducted due to national recommendations for restrictions regarding the COVID-19 Pandemic (e.g. social distancing).  This format is felt to be most appropriate for this patient at this time.  All issues noted in this document were discussed and addressed.  No physical exam was performed (except for noted visual exam findings with Tele health visits).  The patient has consented to conduct a Tele health visit and understands insurance will be billed.    Chief complaint:  Loss of consciousness  73 y.o. Caucasian male with hypertension, stable AAA, aortic atherosclerosis, mixed hyperlipidemia, h/o tobacco abuse, now with syncopal episodes.  No arrhythmia noted on event monitor. Patient has not had any recurrence of syncopal events.   Past Medical History:  Diagnosis Date  . AAA (abdominal aortic aneurysm) ([place])   . Allergy   . B12 deficiency   . DJD (degenerative joint disease)   . DJD (degenerative joint disease)   . DOE (dyspnea on exertion)   . Erectile dysfunction   . GERD (gastroesophageal reflux disease)   . Hearing loss   . Hemorrhagic gastritis   . Hx of colonoscopy 2007  . Hyperlipidemia   . Hypertension   . Insomnia   . Lumbago   . Other B-complex deficiencies   . Peripheral vascular disease ([place])   . PVD (peripheral vascular disease) ([place])   . Renal calculi   .  Vitamin D deficiency      Past Surgical History:  Procedure Laterality Date  . COLONOSCOPY    . KNEE SURGERY    . TONSILLECTOMY       Social History   Socioeconomic History  . Marital status: Married    Spouse name: Not on file  . Number of children: 3  . Years of education: Not on file  . Highest education level: Not on file  Occupational History  . Not on file  Social Needs  . Financial resource strain: Not on file  . Food insecurity    Worry: Not on file    Inability: Not on file  . Transportation needs    Medical: Not on file    Non-medical: Not on file  Tobacco Use  . Smoking status: Former Smoker    Packs/day: 1.00    Years: 54.00    Pack years: 54.00    Types: Cigarettes    Quit date: 05/17/2016    Years since quitting: 2.7  . Smokeless tobacco: Never Used  Substance and Sexual Activity  . Alcohol use: No    Alcohol/week: 0.0 standard drinks    Comment: Quit 2014, drank heavily before that   . Drug use: No  . Sexual activity: Not on file  Lifestyle  . Physical activity    Days per week: Not on file    Minutes per session: Not on file  . Stress: Not on file  Relationships  . Social connections  Talks on phone: Not on file    Gets together: Not on file    Attends religious service: Not on file    Active member of club or organization: Not on file    Attends meetings of clubs or organizations: Not on file    Relationship status: Not on file  . Intimate partner violence    Fear of current or ex partner: Not on file    Emotionally abused: Not on file    Physically abused: Not on file    Forced sexual activity: Not on file  Other Topics Concern  . Not on file  Social History Narrative  . Not on file     Family History  Problem Relation Age of Onset  . Lung cancer Mother   . Peripheral vascular disease Father      Current Outpatient Medications on File Prior to Visit  Medication Sig Dispense Refill  . aspirin 81 MG tablet Take 81 mg by  mouth daily.    Marland Kitchen atenolol (TENORMIN) 25 MG tablet Take 25 mg by mouth daily.    . Cholecalciferol (D3 DOTS) 50 MCG (2000 UT) TBDP Take 2,000 Units by mouth daily.    . clonazePAM (KLONOPIN) 2 MG tablet Take 2 mg by mouth at bedtime.     Marland Kitchen EPINEPHrine (EPIPEN JR) 0.15 MG/0.3ML injection Inject 0.15 mg into the muscle as needed.    Marland Kitchen esomeprazole (NEXIUM) 40 MG capsule Take 40 mg by mouth daily before breakfast.    . felodipine (PLENDIL) 5 MG 24 hr tablet Take 5 mg by mouth daily.    . fluticasone (FLONASE) 50 MCG/ACT nasal spray Place 1-2 sprays into both nostrils daily.    Marland Kitchen gemfibrozil (LOPID) 600 MG tablet Take 600 mg by mouth daily.     . Multiple Vitamin tablet Take 1 tablet by mouth daily.    . nitroGLYCERIN (NITROSTAT) 0.4 MG SL tablet Place 1 tablet (0.4 mg total) under the tongue every 5 (five) minutes as needed for chest pain. 30 tablet 3  . rosuvastatin (CRESTOR) 20 MG tablet TAKE 1 TABLET (20 MG TOTAL) BY MOUTH DAILY. 30 tablet 2  . tamsulosin (FLOMAX) 0.4 MG CAPS capsule Take 1 capsule (0.4 mg total) by mouth daily. 14 capsule 0   No current facility-administered medications on file prior to visit.     Cardiovascular studies:  Event monitor 01/04/2019 - 02/02/2019: SInus rhythm. HR 43-121 bpm. No arrhythmia noted.   EKG 01/04/2019: Sinus rhythm 51 bpm. Low voltage limb leads. Compared to previus EKG, rate is slower.  EKG 12/11/2018: Sinus rhythm 62 bpm. Normal EKG.  Echocardiogram 11/06/2018: Normal LV systolic function with EF 68%. Left ventricle cavity is normal in size. Normal global wall motion. Doppler evidence of grade I (impaired) diastolic dysfunction, normal LAP. Calculated EF 68%. Mild (Grade I) mitral regurgitation. Normal right atrial pressure.  CT Chest 08/16/2017: 1. Lung-RADS 1, negative. Continue annual screening with low-dose chest CT without contrast in 12 months. 2. Aortic atherosclerosis (ICD10-170.0). Coronary artery calcification. 3.   Emphysema (ICD10-J43.9).  US abdomen 08/15/2017: 1. No acute findings. 2. Infrarenal abdominal aortic aneurysm measures 3.0 cm anterior-posterior by 3.5 cm right to left, without significant change from the prior CT allowing for the differences in imaging modalities, and measurement technique. Recommend followup by ultrasound in 3 years. This recommendation follows ACR consensus guidelines: White Paper of the ACR Incidental Findings Committee II on Vascular Findings. J Am Coll Radiol 2013; 45:625-638 3. Small nonobstructing stones in each kidney.  No hydronephrosis.  Recent labs: Results for HUNT[place], LEVERICH (MRN [id number]) as of 02/21/2019 20:46  Ref. Range 02/16/2019 13:58  Total CHOL/HDL Ratio Latest Ref Range: 0.0 - 5.0 ratio 3.2  Cholesterol, Total Latest Ref Range: 100 - 199 mg/dL 158  HDL Cholesterol Latest Ref Range: >39 mg/dL 50  Triglycerides Latest Ref Range: 0 - 149 mg/dL 205 (H)  VLDL Cholesterol Cal Latest Ref Range: 5 - 40 mg/dL 34  LDL Chol Calc (NIH) Latest Ref Range: 0 - 99 mg/dL 74    08/02/2017: Glucose 97, BUN/Cr 9/0.87. eGFR 86. Na/K 142/4.5. Rest of the CMP normal.  H/H 14/44. MCV 92. Platelets 193.  Chol 201, TG 196, HDL 47, LDL 115.   Review of Systems  Constitution: Negative for decreased appetite, malaise/fatigue, weight gain and weight loss.  HENT: Negative for congestion.   Eyes: Negative for visual disturbance.  Cardiovascular: Negative for chest pain, dyspnea on exertion, leg swelling, palpitations and syncope.  Respiratory: Negative for shortness of breath.   Endocrine: Negative for cold intolerance.  Hematologic/Lymphatic: Does not bruise/bleed easily.  Skin: Negative for itching and rash.  Musculoskeletal: Negative for myalgias.  Gastrointestinal: Negative for abdominal pain, nausea and vomiting.  Genitourinary: Negative for dysuria.  Neurological: Positive for numbness (In feet. Longstanding). Negative for dizziness and weakness.   Psychiatric/Behavioral: The patient is not nervous/anxious.   All other systems reviewed and are negative.     Vitals:   02/21/19 1539  BP: 127/63  Pulse: 66    Objective:      Physical Exam   Not performed. Telephone visit.      Assessment & Recommendations:   73 y.o. Caucasian male with hypertension, stable AAA, aortic atherosclerosis, mixed hyperlipidemia, h/o tobacco abuse, now with syncopal episodes.  Syncope: No recurrence of syncope episodes. Event monitor does not show any arrhythmia. With his age, he remains at risk of degenerative conduction abnormalities. I discussed watchful monitoring vs loop recorder placement. Patient would prefer the latter.  Primary cardiac prevention: Given risk factors for CAD and coronary calcification, continue aspirin 81 mg daily. He is tolerating rosuvastatin and gemfibrozil without any side effects. Lipid panel improved.   AAA: This is followed by Dr. Inda Merlin' office. Patient tells me that he recently underwent annual [place] and was reportedly stable.  F/u in after 6 months.    Nigel Mormon, MD [facility] Pager: [phone] Office: [phone] If no answer Cell [phone]

## 2019-02-13 DIAGNOSIS — E782 Mixed hyperlipidemia: Secondary | ICD-10-CM | POA: Diagnosis not present

## 2019-02-13 DIAGNOSIS — I10 Essential (primary) hypertension: Secondary | ICD-10-CM | POA: Diagnosis not present

## 2019-02-16 DIAGNOSIS — E782 Mixed hyperlipidemia: Secondary | ICD-10-CM | POA: Diagnosis not present

## 2019-02-17 LAB — LIPID PANEL
Chol/HDL Ratio: 3.2 ratio (ref 0.0–5.0)
Cholesterol, Total: 158 mg/dL (ref 100–199)
HDL: 50 mg/dL (ref >39)
LDL Chol Calc (NIH): 74 mg/dL (ref 0–99)
Triglycerides: 205 mg/dL — ABNORMAL HIGH (ref 0–149)
VLDL Cholesterol Cal: 34 mg/dL (ref 5–40)

## 2019-02-21 ENCOUNTER — Other Ambulatory Visit: Payer: Self-pay

## 2019-02-21 ENCOUNTER — Encounter: Payer: Self-pay | Admitting: Cardiology

## 2019-02-21 ENCOUNTER — Telehealth: Payer: PPO | Admitting: Cardiology

## 2019-02-21 VITALS — BP 127/63 | HR 66 | Ht 70.0 in | Wt 195.0 lb

## 2019-02-21 DIAGNOSIS — R55 Syncope and collapse: Secondary | ICD-10-CM | POA: Diagnosis not present

## 2019-02-21 DIAGNOSIS — E782 Mixed hyperlipidemia: Secondary | ICD-10-CM

## 2019-02-21 DIAGNOSIS — I2510 Atherosclerotic heart disease of native coronary artery without angina pectoris: Secondary | ICD-10-CM | POA: Diagnosis not present

## 2019-03-15 ENCOUNTER — Ambulatory Visit: Payer: PPO | Admitting: Cardiology

## 2019-03-15 DIAGNOSIS — E782 Mixed hyperlipidemia: Secondary | ICD-10-CM | POA: Diagnosis not present

## 2019-03-15 DIAGNOSIS — I10 Essential (primary) hypertension: Secondary | ICD-10-CM | POA: Diagnosis not present

## 2019-03-19 ENCOUNTER — Other Ambulatory Visit: Payer: Self-pay | Admitting: Cardiology

## 2019-03-19 DIAGNOSIS — E782 Mixed hyperlipidemia: Secondary | ICD-10-CM

## 2019-03-19 MED ORDER — ROSUVASTATIN CALCIUM 20 MG PO TABS: 20.0000 mg | ORAL_TABLET | Freq: Every day | ORAL | 3 refills | Status: DC

## 2019-03-20 DIAGNOSIS — E782 Mixed hyperlipidemia: Secondary | ICD-10-CM | POA: Diagnosis not present

## 2019-03-20 DIAGNOSIS — I10 Essential (primary) hypertension: Secondary | ICD-10-CM | POA: Diagnosis not present

## 2019-04-19 DIAGNOSIS — I10 Essential (primary) hypertension: Secondary | ICD-10-CM | POA: Diagnosis not present

## 2019-04-19 DIAGNOSIS — E782 Mixed hyperlipidemia: Secondary | ICD-10-CM | POA: Diagnosis not present

## 2019-05-15 DIAGNOSIS — S80862A Insect bite (nonvenomous), left lower leg, initial encounter: Secondary | ICD-10-CM | POA: Diagnosis not present

## 2019-05-15 DIAGNOSIS — R519 Headache, unspecified: Secondary | ICD-10-CM | POA: Diagnosis not present

## 2019-05-15 DIAGNOSIS — R209 Unspecified disturbances of skin sensation: Secondary | ICD-10-CM | POA: Diagnosis not present

## 2019-05-15 DIAGNOSIS — R1031 Right lower quadrant pain: Secondary | ICD-10-CM | POA: Diagnosis not present

## 2019-06-09 DIAGNOSIS — I10 Essential (primary) hypertension: Secondary | ICD-10-CM | POA: Diagnosis not present

## 2019-06-09 DIAGNOSIS — E782 Mixed hyperlipidemia: Secondary | ICD-10-CM | POA: Diagnosis not present

## 2019-06-14 ENCOUNTER — Ambulatory Visit: Payer: PPO

## 2019-06-22 ENCOUNTER — Ambulatory Visit: Payer: PPO | Attending: Internal Medicine

## 2019-06-22 DIAGNOSIS — Z23 Encounter for immunization: Secondary | ICD-10-CM | POA: Insufficient documentation

## 2019-06-22 NOTE — Progress Notes (Signed)
   Covid-19 Vaccination Clinic  Name:  William Warner    MRN: [id number] DOB: [birth year]  06/22/2019  [name] was observed post Covid-19 immunization for 15 minutes without incidence. He was provided with Vaccine Information Sheet and instruction to access the V-Safe system.   [name] was instructed to call 911 with any severe reactions post vaccine: Marland Kitchen Difficulty breathing  . Swelling of your face and throat  . A fast heartbeat  . A bad rash all over your body  . Dizziness and weakness    Immunizations Administered    Name Date Dose VIS Date Route   Pfizer COVID-19 Vaccine 06/22/2019  3:47 PM 0.3 mL 04/27/2019 Intramuscular   Manufacturer: [place]   Lot: [id number]   [place]: [id number]

## 2019-06-25 ENCOUNTER — Ambulatory Visit: Payer: PPO

## 2019-07-18 ENCOUNTER — Ambulatory Visit: Payer: PPO | Attending: Internal Medicine

## 2019-07-18 DIAGNOSIS — Z23 Encounter for immunization: Secondary | ICD-10-CM | POA: Insufficient documentation

## 2019-07-18 NOTE — Progress Notes (Signed)
   Covid-19 Vaccination Clinic  Name:  William Warner    MRN: [id number] DOB: May 05, 1946  07/18/2019  Mr. William Warner was observed post Covid-19 immunization for 15 minutes without incident. He was provided with Vaccine Information Sheet and instruction to access the V-Safe system.   Mr. William Warner was instructed to call 911 with any severe reactions post vaccine: Marland Kitchen Difficulty breathing  . Swelling of face and throat  . A fast heartbeat  . A bad rash all over body  . Dizziness and weakness   Immunizations Administered    Name Date Dose VIS Date Route   Pfizer COVID-19 Vaccine 07/18/2019  1:07 PM 0.3 mL 04/27/2019 Intramuscular   Manufacturer: [place]   Lot: [id number]   [place]: [id number]

## 2019-08-23 ENCOUNTER — Ambulatory Visit: Payer: PPO | Admitting: Cardiology

## 2019-08-31 ENCOUNTER — Ambulatory Visit: Payer: PPO | Admitting: Cardiology

## 2019-08-31 ENCOUNTER — Encounter: Payer: Self-pay | Admitting: Cardiology

## 2019-08-31 ENCOUNTER — Other Ambulatory Visit: Payer: Self-pay

## 2019-08-31 VITALS — BP 118/58 | HR 52 | Temp 97.9°F | Resp 16 | Ht 70.0 in | Wt 193.7 lb

## 2019-08-31 DIAGNOSIS — E782 Mixed hyperlipidemia: Secondary | ICD-10-CM

## 2019-08-31 DIAGNOSIS — I714 Abdominal aortic aneurysm, without rupture, unspecified: Secondary | ICD-10-CM | POA: Insufficient documentation

## 2019-08-31 DIAGNOSIS — R55 Syncope and collapse: Secondary | ICD-10-CM | POA: Diagnosis not present

## 2019-08-31 DIAGNOSIS — I739 Peripheral vascular disease, unspecified: Secondary | ICD-10-CM | POA: Diagnosis not present

## 2019-08-31 NOTE — Progress Notes (Signed)
Subjective:   William Warner, male    DOB: 02-13-1946, 75 y.o.   MRN: [id number]   Chief complaint:  Loss of consciousness  74 y.o. Caucasian male with hypertension, stable AAA, aortic atherosclerosis, mixed hyperlipidemia, h/o tobacco abuse, now with syncopal episodes.  No arrhythmia noted on event monitor. Patient has not had any recurrence of syncopal events. He complains of left hip pain on walking, that improves with rest. He does not have rest pain.   Current Outpatient Medications on File Prior to Visit  Medication Sig Dispense Refill  . aspirin 81 MG tablet Take 81 mg by mouth daily.    Marland Kitchen atenolol (TENORMIN) 25 MG tablet Take 25 mg by mouth daily.    . Cholecalciferol (D3 DOTS) 50 MCG (2000 UT) TBDP Take 2,000 Units by mouth daily.    . clonazePAM (KLONOPIN) 2 MG tablet Take 2 mg by mouth at bedtime.     Marland Kitchen EPINEPHrine (EPIPEN JR) 0.15 MG/0.3ML injection Inject 0.15 mg into the muscle as needed.    Marland Kitchen esomeprazole (NEXIUM) 40 MG capsule Take 40 mg by mouth daily before breakfast.    . felodipine (PLENDIL) 5 MG 24 hr tablet Take 5 mg by mouth daily.    . fluticasone (FLONASE) 50 MCG/ACT nasal spray Place 1-2 sprays into both nostrils daily.    Marland Kitchen gemfibrozil (LOPID) 600 MG tablet Take 600 mg by mouth daily.     . Multiple Vitamin tablet Take 1 tablet by mouth daily.    . nitroGLYCERIN (NITROSTAT) 0.4 MG SL tablet Place 1 tablet (0.4 mg total) under the tongue every 5 (five) minutes as needed for chest pain. 30 tablet 3  . rosuvastatin (CRESTOR) 20 MG tablet Take 1 tablet (20 mg total) by mouth daily. 90 tablet 3  . tamsulosin (FLOMAX) 0.4 MG CAPS capsule Take 1 capsule (0.4 mg total) by mouth daily. 14 capsule 0   No current facility-administered medications on file prior to visit.    Cardiovascular studies:  EKG 08/31/2019: Sinus rhythm 51 bpm. Left axis deviation. Low voltage. Poor R-wave progression. Cannot exclude old anteroseptal infarct.  Event monitor 01/04/2019  - 02/02/2019: SInus rhythm. HR 43-121 bpm. No arrhythmia noted.   Echocardiogram 11/06/2018: Normal LV systolic function with EF 68%. Left ventricle cavity is normal in size. Normal global wall motion. Doppler evidence of grade I (impaired) diastolic dysfunction, normal LAP. Calculated EF 68%. Mild (Grade I) mitral regurgitation. Normal right atrial pressure.  CT Chest 08/16/2017: 1. Lung-RADS 1, negative. Continue annual screening with low-dose chest CT without contrast in 12 months. 2. Aortic atherosclerosis (ICD10-170.0). Coronary artery calcification. 3.  Emphysema (ICD10-J43.9).  US abdomen 08/15/2017: 1. No acute findings. 2. Infrarenal abdominal aortic aneurysm measures 3.0 cm anterior-posterior by 3.5 cm right to left, without significant change from the prior CT allowing for the differences in imaging modalities, and measurement technique. Recommend followup by ultrasound in 3 years. This recommendation follows ACR consensus guidelines: White Paper of the ACR Incidental Findings Committee II on Vascular Findings. J Am Coll Radiol 2013; 92:426-834 3. Small nonobstructing stones in each kidney.  No hydronephrosis.  Recent labs: 02/16/2019: Glucose 108, BUN/Cr 11/0.9. eGFR 78.  HbA1C 5.7% Chol 158, TG 205, HDL 50, LDL 74  08/02/2017: Glucose 97, BUN/Cr 9/0.87. eGFR 86. Na/K 142/4.5. Rest of the CMP normal.  H/H 14/44. MCV 92. Platelets 193.  Chol 201, TG 196, HDL 47, LDL 115.   Review of Systems  Cardiovascular: Negative for chest pain, dyspnea on exertion, leg  swelling, palpitations and syncope.  Respiratory: Negative for shortness of breath.   Neurological: Positive for numbness (In feet. Longstanding).  All other systems reviewed and are negative.    \ Vitals:   08/31/19 1250  BP: (!) 118/58  Pulse: (!) 52  Resp: 16  Temp: 97.9 F (36.6 C)  SpO2: 97%    Objective:      Physical Exam  Constitutional: He appears well-developed and well-nourished.   Neck: No JVD present.  Cardiovascular: Normal rate, regular rhythm and normal heart sounds.  No murmur heard. Pulses:      Femoral pulses are 2+ on the right side and 3+ on the left side.      Popliteal pulses are 1+ on the right side and 1+ on the left side.       Dorsalis pedis pulses are 1+ on the right side and 1+ on the left side.       Posterior tibial pulses are 0 on the right side and 0 on the left side.  Pulmonary/Chest: Effort normal and breath sounds normal. He has no wheezes. He has no rales.  Musculoskeletal:        General: Edema (Trace b/l) present.  Nursing note and vitals reviewed.      Assessment & Recommendations:   74 y.o. Caucasian male with hypertension, stable AAA, aortic atherosclerosis, mixed hyperlipidemia, h/o tobacco abuse, now with syncopal episodes.  Left hip pain: Exertional, possible claudication.  Femoral pulse 2+ on left, diminished pedal pulses. Continue medical management, encourage walking.   Patient gets his surveillance duplex ultrasound from his PCP for his small AAA.  Patient would like to talk to his PCP and let us know if he would be okay to perform his aorta duplex in our office, along with screening ABI.  Syncope: No recurrence of syncope episodes. Event monitor does not show any arrhythmia.   F/u in 2 months  William Mcnally Esther Hardy, MD [facility] Pager: [phone] Office: [phone] If no answer Cell [phone]

## 2019-09-04 ENCOUNTER — Ambulatory Visit
Admission: RE | Admit: 2019-09-04 | Discharge: 2019-09-04 | Disposition: A | Discharge status: Home / Self Care | Payer: PPO | Source: Ambulatory Visit | Attending: Internal Medicine | Admitting: Internal Medicine

## 2019-09-04 ENCOUNTER — Other Ambulatory Visit: Payer: Self-pay | Admitting: Internal Medicine

## 2019-09-04 DIAGNOSIS — E559 Vitamin D deficiency, unspecified: Secondary | ICD-10-CM | POA: Diagnosis not present

## 2019-09-04 DIAGNOSIS — Z1389 Encounter for screening for other disorder: Secondary | ICD-10-CM | POA: Diagnosis not present

## 2019-09-04 DIAGNOSIS — G4700 Insomnia, unspecified: Secondary | ICD-10-CM | POA: Diagnosis not present

## 2019-09-04 DIAGNOSIS — D81818 Other biotin-dependent carboxylase deficiency: Secondary | ICD-10-CM | POA: Diagnosis not present

## 2019-09-04 DIAGNOSIS — B351 Tinea unguium: Secondary | ICD-10-CM | POA: Diagnosis not present

## 2019-09-04 DIAGNOSIS — E782 Mixed hyperlipidemia: Secondary | ICD-10-CM | POA: Diagnosis not present

## 2019-09-04 DIAGNOSIS — M25552 Pain in left hip: Secondary | ICD-10-CM

## 2019-09-04 DIAGNOSIS — Z79899 Other long term (current) drug therapy: Secondary | ICD-10-CM | POA: Diagnosis not present

## 2019-09-04 DIAGNOSIS — I10 Essential (primary) hypertension: Secondary | ICD-10-CM | POA: Diagnosis not present

## 2019-09-04 DIAGNOSIS — I700 Atherosclerosis of aorta: Secondary | ICD-10-CM | POA: Diagnosis not present

## 2019-09-04 DIAGNOSIS — I714 Abdominal aortic aneurysm, without rupture: Secondary | ICD-10-CM | POA: Diagnosis not present

## 2019-09-04 DIAGNOSIS — Z0000 Encounter for general adult medical examination without abnormal findings: Secondary | ICD-10-CM | POA: Diagnosis not present

## 2019-09-04 DIAGNOSIS — M179 Osteoarthritis of knee, unspecified: Secondary | ICD-10-CM | POA: Diagnosis not present

## 2019-09-10 DIAGNOSIS — M25552 Pain in left hip: Secondary | ICD-10-CM | POA: Diagnosis not present

## 2019-09-11 ENCOUNTER — Other Ambulatory Visit: Payer: Self-pay | Admitting: Cardiology

## 2019-09-11 DIAGNOSIS — I10 Essential (primary) hypertension: Secondary | ICD-10-CM | POA: Diagnosis not present

## 2019-09-11 DIAGNOSIS — E782 Mixed hyperlipidemia: Secondary | ICD-10-CM | POA: Diagnosis not present

## 2019-09-11 DIAGNOSIS — M179 Osteoarthritis of knee, unspecified: Secondary | ICD-10-CM | POA: Diagnosis not present

## 2019-09-11 DIAGNOSIS — G4700 Insomnia, unspecified: Secondary | ICD-10-CM | POA: Diagnosis not present

## 2019-09-15 HISTORY — PX: BASAL CELL CARCINOMA EXCISION: SHX1214

## 2019-09-24 ENCOUNTER — Encounter (HOSPITAL_COMMUNITY): Payer: Self-pay | Admitting: *Deleted

## 2019-09-24 ENCOUNTER — Other Ambulatory Visit: Payer: Self-pay

## 2019-09-24 ENCOUNTER — Emergency Department (HOSPITAL_COMMUNITY)
Admission: EM | Admit: 2019-09-24 | Discharge: 2019-09-25 | Disposition: A | Discharge status: Home / Self Care | Payer: PPO | Attending: Emergency Medicine | Admitting: Emergency Medicine

## 2019-09-24 DIAGNOSIS — Z5321 Procedure and treatment not carried out due to patient leaving prior to being seen by health care provider: Secondary | ICD-10-CM | POA: Insufficient documentation

## 2019-09-24 DIAGNOSIS — R319 Hematuria, unspecified: Secondary | ICD-10-CM | POA: Diagnosis not present

## 2019-09-24 DIAGNOSIS — R109 Unspecified abdominal pain: Secondary | ICD-10-CM | POA: Diagnosis not present

## 2019-09-24 DIAGNOSIS — M549 Dorsalgia, unspecified: Secondary | ICD-10-CM | POA: Diagnosis not present

## 2019-09-24 LAB — COMPREHENSIVE METABOLIC PANEL
ALT: 14 U/L (ref 0–44)
AST: 22 U/L (ref 15–41)
Albumin: 3.9 g/dL (ref 3.5–5.0)
Alkaline Phosphatase: 50 U/L (ref 38–126)
Anion gap: 11 (ref 5–15)
BUN: 9 mg/dL (ref 8–23)
CO2: 26 mmol/L (ref 22–32)
Calcium: 9.1 mg/dL (ref 8.9–10.3)
Chloride: 103 mmol/L (ref 98–111)
Creatinine, Ser: 0.87 mg/dL (ref 0.61–1.24)
GFR calc Af Amer: >60 mL/min (ref >60)
GFR calc non Af Amer: >60 mL/min (ref >60)
Glucose, Bld: 94 mg/dL (ref 70–99)
Potassium: 4.4 mmol/L (ref 3.5–5.1)
Sodium: 140 mmol/L (ref 135–145)
Total Bilirubin: 0.8 mg/dL (ref 0.3–1.2)
Total Protein: 7.1 g/dL (ref 6.5–8.1)

## 2019-09-24 LAB — CBC
HCT: 44.9 % (ref 39.0–52.0)
Hemoglobin: 14.3 g/dL (ref 13.0–17.0)
MCH: 30.6 pg (ref 26.0–34.0)
MCHC: 31.8 g/dL (ref 30.0–36.0)
MCV: 95.9 fL (ref 80.0–100.0)
Platelets: 184 10*3/uL (ref 150–400)
RBC: 4.68 MIL/uL (ref 4.22–5.81)
RDW: 12.9 % (ref 11.5–15.5)
WBC: 7.9 10*3/uL (ref 4.0–10.5)
nRBC: 0 % (ref 0.0–0.2)

## 2019-09-24 LAB — LIPASE, BLOOD: Lipase: 31 U/L (ref 11–51)

## 2019-09-24 MED ORDER — SODIUM CHLORIDE 0.9% FLUSH: 3.0000 mL | Freq: Once | INTRAVENOUS | Status: DC

## 2019-09-24 NOTE — ED Triage Notes (Addendum)
Pt has had R sided flank, abd, back pain starting yesterday. Hx of kidney stones, reports pain is usually only in the back with other kidney stones. Pt also reports blood in urine earlier today. Pt tender to RUQ

## 2019-09-25 LAB — URINALYSIS, ROUTINE W REFLEX MICROSCOPIC
Bilirubin Urine: NEGATIVE
Glucose, UA: NEGATIVE mg/dL
Ketones, ur: NEGATIVE mg/dL
Leukocytes,Ua: NEGATIVE
Nitrite: NEGATIVE
Protein, ur: NEGATIVE mg/dL
Specific Gravity, Urine: 1.003 — ABNORMAL LOW (ref 1.005–1.030)
pH: 6 (ref 5.0–8.0)

## 2019-09-25 NOTE — ED Notes (Signed)
Pt checked out.

## 2019-09-26 ENCOUNTER — Ambulatory Visit: Payer: PPO

## 2019-09-26 ENCOUNTER — Other Ambulatory Visit: Payer: Self-pay

## 2019-09-26 DIAGNOSIS — I739 Peripheral vascular disease, unspecified: Secondary | ICD-10-CM

## 2019-09-26 DIAGNOSIS — I714 Abdominal aortic aneurysm, without rupture, unspecified: Secondary | ICD-10-CM

## 2019-10-08 DIAGNOSIS — D2239 Melanocytic nevi of other parts of face: Secondary | ICD-10-CM | POA: Diagnosis not present

## 2019-10-08 DIAGNOSIS — B356 Tinea cruris: Secondary | ICD-10-CM | POA: Diagnosis not present

## 2019-10-08 DIAGNOSIS — Z85828 Personal history of other malignant neoplasm of skin: Secondary | ICD-10-CM | POA: Diagnosis not present

## 2019-10-08 DIAGNOSIS — C44311 Basal cell carcinoma of skin of nose: Secondary | ICD-10-CM | POA: Diagnosis not present

## 2019-10-08 DIAGNOSIS — C44712 Basal cell carcinoma of skin of right lower limb, including hip: Secondary | ICD-10-CM | POA: Diagnosis not present

## 2019-10-08 DIAGNOSIS — B351 Tinea unguium: Secondary | ICD-10-CM | POA: Diagnosis not present

## 2019-10-08 DIAGNOSIS — L821 Other seborrheic keratosis: Secondary | ICD-10-CM | POA: Diagnosis not present

## 2019-10-08 DIAGNOSIS — D485 Neoplasm of uncertain behavior of skin: Secondary | ICD-10-CM | POA: Diagnosis not present

## 2019-10-18 DIAGNOSIS — H25013 Cortical age-related cataract, bilateral: Secondary | ICD-10-CM | POA: Diagnosis not present

## 2019-10-18 DIAGNOSIS — H5203 Hypermetropia, bilateral: Secondary | ICD-10-CM | POA: Diagnosis not present

## 2019-10-18 DIAGNOSIS — H2513 Age-related nuclear cataract, bilateral: Secondary | ICD-10-CM | POA: Diagnosis not present

## 2019-10-18 DIAGNOSIS — H524 Presbyopia: Secondary | ICD-10-CM | POA: Diagnosis not present

## 2019-10-23 IMAGING — CT CT CHEST LUNG CANCER SCREENING LOW DOSE
2 of 3 series · 15 of 36 positions shown, 18 images · non-contrast
Comparison: Low-dose lung cancer screening chest CT 09/12/2017.

CLINICAL DATA: 72-year-old male former smoker (quit in November 2016)
with 55 pack-year history of smoking. Lung cancer screening
examination.

EXAM:
CT CHEST WITHOUT CONTRAST LOW-DOSE FOR LUNG CANCER SCREENING
TECHNIQUE: Multidetector CT imaging of the chest was performed following the
standard protocol without IV contrast.

[Series 2: thorax 5.0 i31f 3 · axial · 0.74mm/px · z∈[-353,-58]mm · 12 of 71 slices shown, 15 images]
[im 6/71  mediastinal]
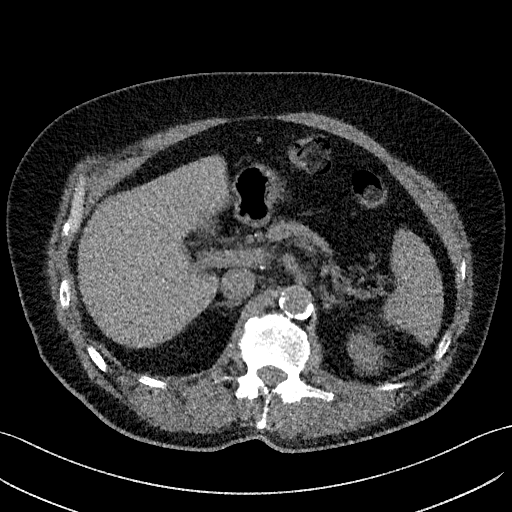
[im 6/71  lung]
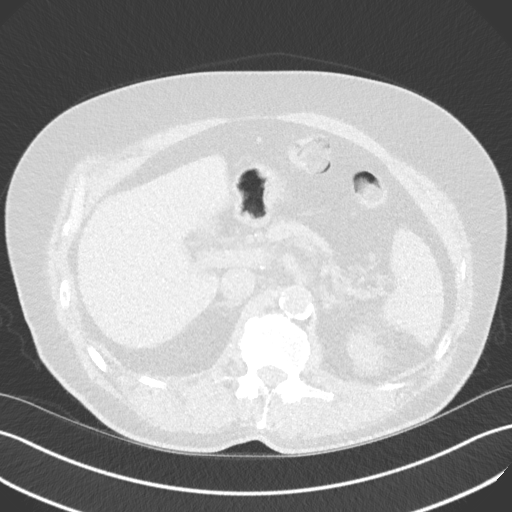
[im 11/71  lung]
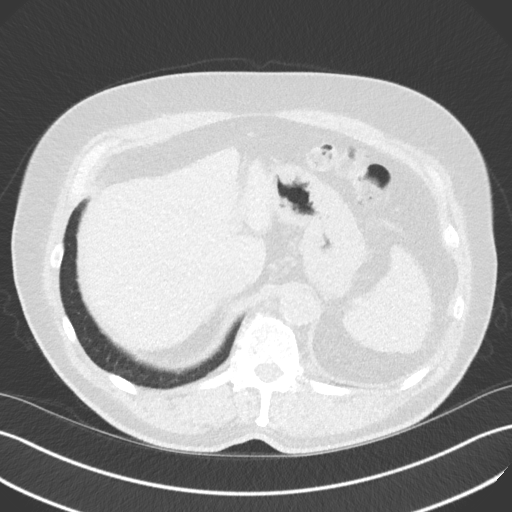
[im 16/71  lung]
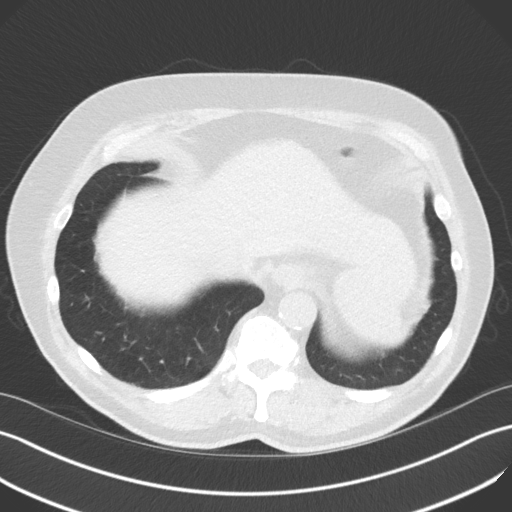
[im 21/71  lung]
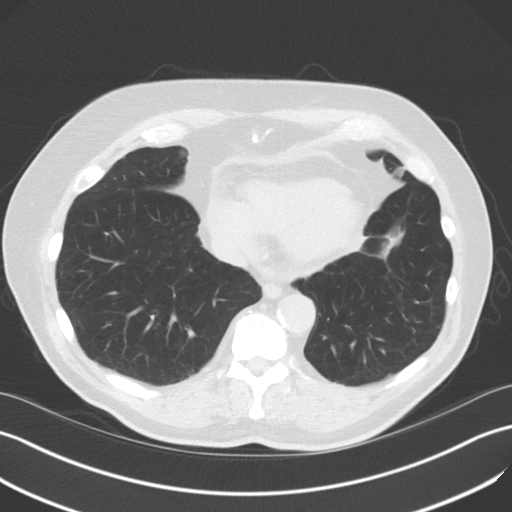
[im 26/71  mediastinal]
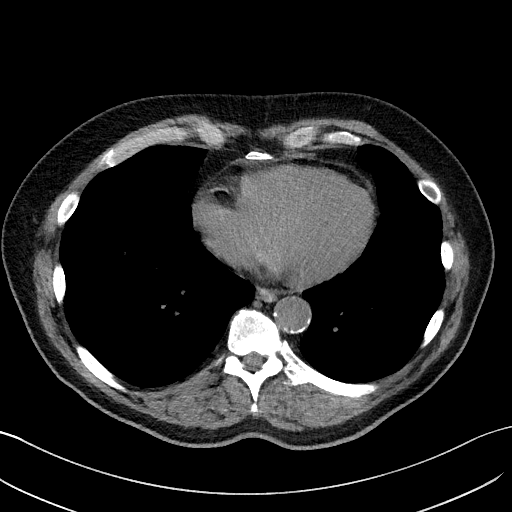
[im 26/71  lung]
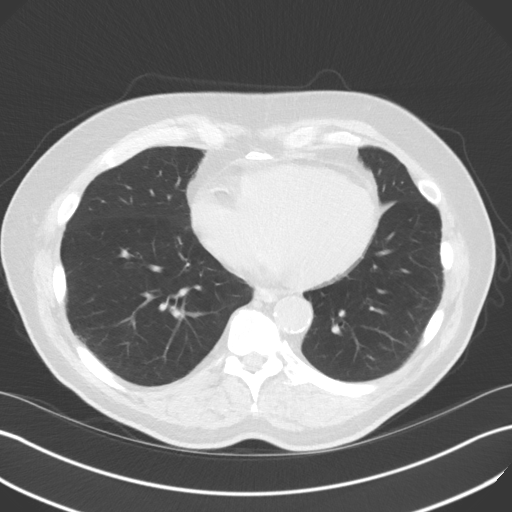
[im 32/71  lung]
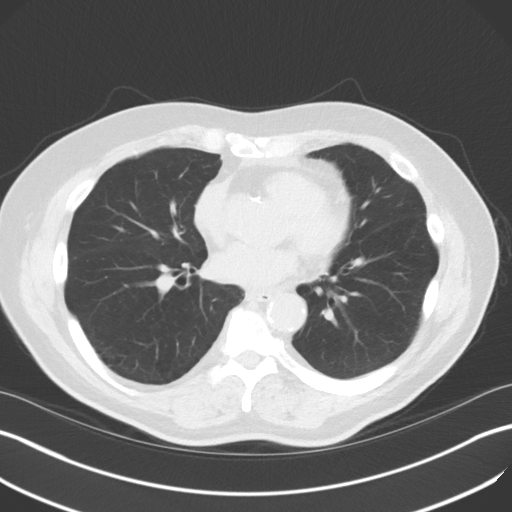
[im 39/71  lung]
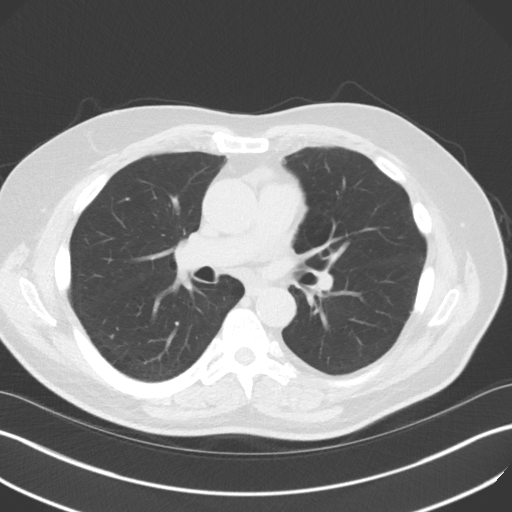
[im 45/71  lung]
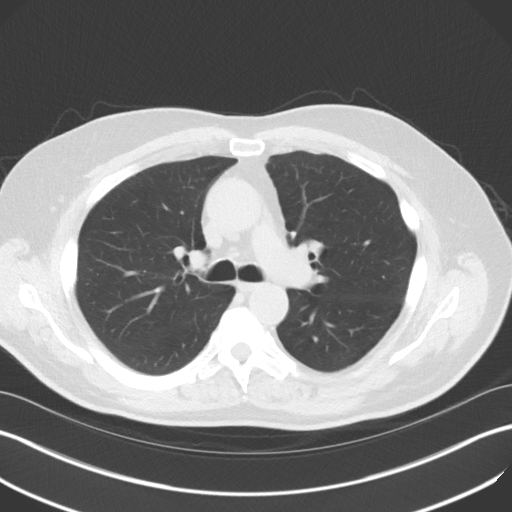
[im 50/71  mediastinal]
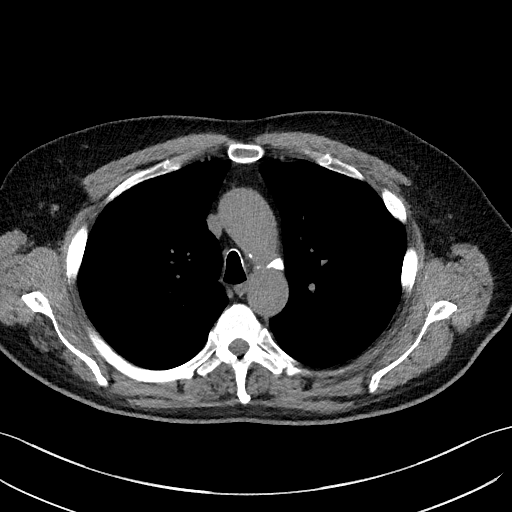
[im 50/71  lung]
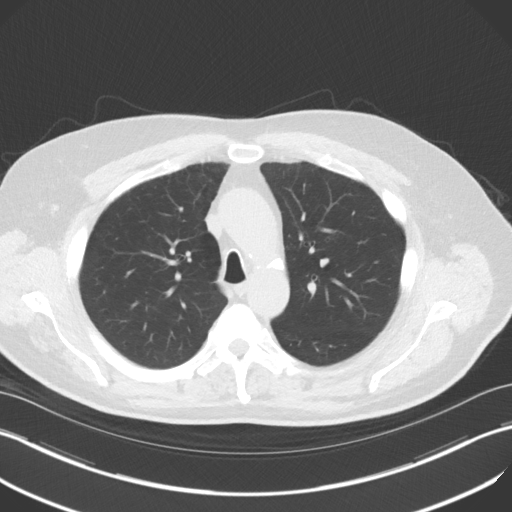
[im 55/71  lung]
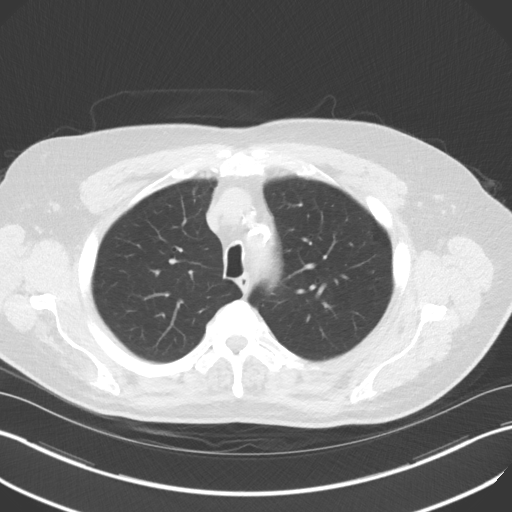
[im 60/71  lung]
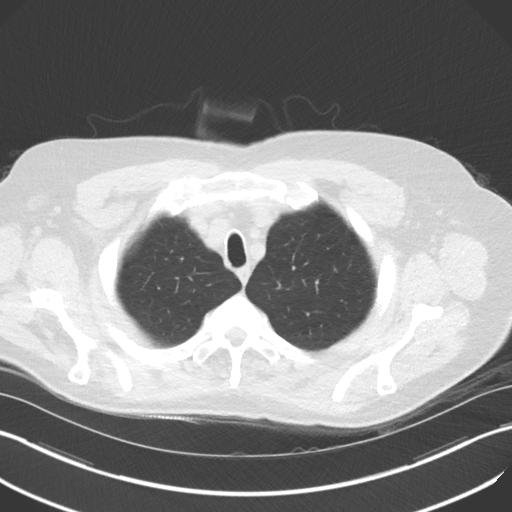
[im 65/71  lung]
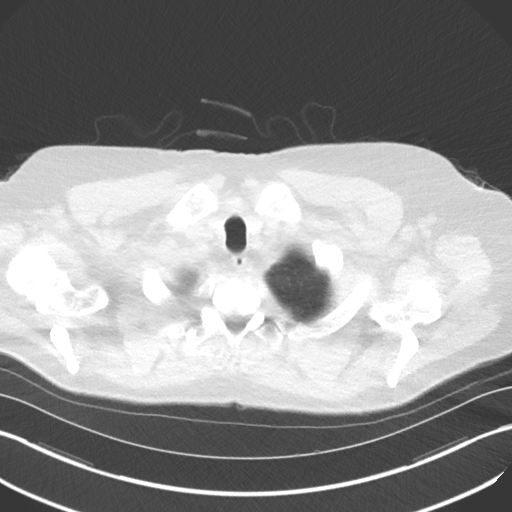

[Series 5: coronal · coronal · 0.67mm/px · 3 of 121 slices shown]
[im 25/121  lung]
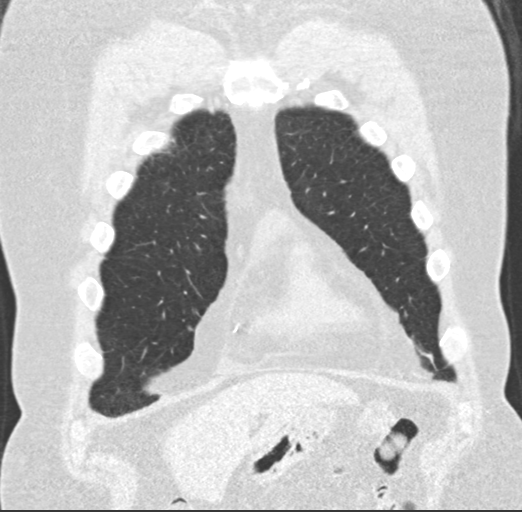
[im 49/121  lung]
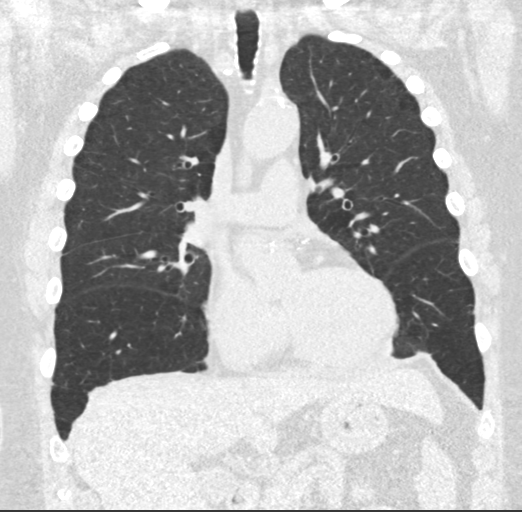
[im 73/121  lung]
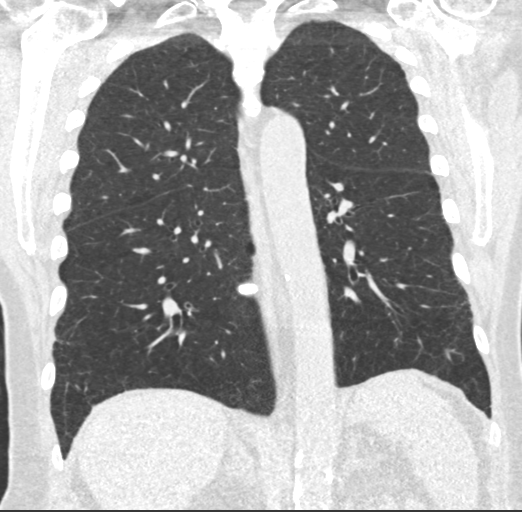

[15 of 36 positions shown; findings below may reference images not displayed]

FINDINGS: Cardiovascular: Heart size is normal. Trace amount of anterior
pericardial fluid and/or thickening, unlikely to be of hemodynamic
significance at this time, and similar to the prior study. No
associated pericardial calcification. There is aortic
atherosclerosis, as well as atherosclerosis of the great vessels of
the mediastinum and the coronary arteries, including calcified
atherosclerotic plaque in the left main, left anterior descending,
left circumflex and right coronary arteries.

Mediastinum/Nodes: No pathologically enlarged mediastinal or hilar
lymph nodes. Please note that accurate exclusion of hilar adenopathy
is limited on noncontrast CT scans. Esophagus is unremarkable in
appearance. No axillary lymphadenopathy.

Lungs/Pleura: Tiny calcified granuloma in the periphery of the right
lower lobe. No other suspicious appearing pulmonary nodules or
masses are noted. No acute consolidative airspace disease. No
pleural effusions. Mild diffuse bronchial wall thickening with mild
centrilobular and paraseptal emphysema.

Upper Abdomen: Aortic atherosclerosis. 2 mm nonobstructive calculus
in the upper pole collecting system of left kidney.

Musculoskeletal: There are no aggressive appearing lytic or blastic
lesions noted in the visualized portions of the skeleton.
IMPRESSION: 1. Lung-RADS 1S, negative. Continue annual screening with low-dose
chest CT without contrast in 12 months.
2. The "S" modifier above refers to potentially clinically
significant non lung cancer related findings. Specifically, there is
aortic atherosclerosis, in addition to left main and 3 vessel
coronary artery disease. Assessment for potential risk factor
modification, dietary therapy or pharmacologic therapy may be
warranted, if clinically indicated.
3. Mild diffuse bronchial wall thickening with mild centrilobular
and paraseptal emphysema; imaging findings suggestive of underlying
COPD.

Aortic Atherosclerosis ([id number].G) and Emphysema ([id number].K).

## 2019-10-29 ENCOUNTER — Ambulatory Visit (INDEPENDENT_AMBULATORY_CARE_PROVIDER_SITE_OTHER)
Admission: RE | Admit: 2019-10-29 | Discharge: 2019-10-29 | Disposition: A | Discharge status: Home / Self Care | Payer: PPO | Source: Ambulatory Visit | Attending: Acute Care | Admitting: Acute Care

## 2019-10-29 ENCOUNTER — Other Ambulatory Visit: Payer: Self-pay

## 2019-10-29 DIAGNOSIS — Z87891 Personal history of nicotine dependence: Secondary | ICD-10-CM | POA: Diagnosis not present

## 2019-10-29 DIAGNOSIS — Z122 Encounter for screening for malignant neoplasm of respiratory organs: Secondary | ICD-10-CM

## 2019-10-30 DIAGNOSIS — C44311 Basal cell carcinoma of skin of nose: Secondary | ICD-10-CM | POA: Diagnosis not present

## 2019-10-30 DIAGNOSIS — Z85828 Personal history of other malignant neoplasm of skin: Secondary | ICD-10-CM | POA: Diagnosis not present

## 2019-11-01 NOTE — Progress Notes (Signed)
Please call patient and let them  know their  low dose Ct was read as a Lung RADS 2: nodules that are benign in appearance and behavior with a very low likelihood of becoming a clinically active cancer due to size or lack of growth. Recommendation per radiology is for a repeat LDCT in 12 months. .Please let them  know we will order and schedule their  annual screening scan for 10/2020. Please let them  know there was notation of CAD on their  scan.  Please remind the patient  that this is a non-gated exam therefore degree or severity of disease  cannot be determined. Please have them  follow up with their PCP regarding potential risk factor modification, dietary therapy or pharmacologic therapy if clinically indicated. Pt.  is  currently on statin therapy. Please place order for annual  screening scan for 10/2020 and fax results to PCP. Thanks so much. 

## 2019-11-02 ENCOUNTER — Other Ambulatory Visit: Payer: Self-pay | Admitting: *Deleted

## 2019-11-02 ENCOUNTER — Ambulatory Visit: Payer: PPO | Admitting: Cardiology

## 2019-11-02 DIAGNOSIS — Z87891 Personal history of nicotine dependence: Secondary | ICD-10-CM

## 2019-11-06 DIAGNOSIS — Z79899 Other long term (current) drug therapy: Secondary | ICD-10-CM | POA: Diagnosis not present

## 2019-11-15 ENCOUNTER — Ambulatory Visit: Payer: PPO | Admitting: Cardiology

## 2019-11-15 ENCOUNTER — Encounter: Payer: Self-pay | Admitting: Cardiology

## 2019-11-15 ENCOUNTER — Other Ambulatory Visit: Payer: Self-pay

## 2019-11-15 VITALS — BP 130/62 | HR 53 | Ht 70.0 in | Wt 190.6 lb

## 2019-11-15 DIAGNOSIS — I2510 Atherosclerotic heart disease of native coronary artery without angina pectoris: Secondary | ICD-10-CM | POA: Diagnosis not present

## 2019-11-15 DIAGNOSIS — I714 Abdominal aortic aneurysm, without rupture, unspecified: Secondary | ICD-10-CM

## 2019-11-15 DIAGNOSIS — E782 Mixed hyperlipidemia: Secondary | ICD-10-CM | POA: Diagnosis not present

## 2019-11-15 NOTE — Progress Notes (Signed)
Subjective:   William Warner, male    DOB: Oct 10, 1945, 74 y.o.   MRN: [id number]   Chief complaint:  Loss of consciousness  74 y.o. Caucasian male with hypertension, stable AAA, aortic atherosclerosis, mixed hyperlipidemia, h/o tobacco abuse,   ABI was nearly normal.  Since his last visit, he underwent surgery for basal cell carcinoma on right shin and nose.  He is recovering well.  He also had issues with kidney stones, recovering well from the same.He denies chest pain, shortness of breath, palpitations, leg edema, orthopnea, PND, TIA/syncope.   Current Outpatient Medications on File Prior to Visit  Medication Sig Dispense Refill  . aspirin 81 MG tablet Take 81 mg by mouth daily.    Marland Kitchen atenolol (TENORMIN) 25 MG tablet Take 25 mg by mouth daily.    . Cholecalciferol (D3 DOTS) 50 MCG (2000 UT) TBDP Take 2,000 Units by mouth daily.    . clonazePAM (KLONOPIN) 2 MG tablet Take 2 mg by mouth at bedtime.     Marland Kitchen EPINEPHrine (EPIPEN JR) 0.15 MG/0.3ML injection Inject 0.15 mg into the muscle as needed.    Marland Kitchen esomeprazole (NEXIUM) 40 MG capsule Take 40 mg by mouth daily before breakfast.    . felodipine (PLENDIL) 5 MG 24 hr tablet Take 5 mg by mouth daily.    Marland Kitchen gemfibrozil (LOPID) 600 MG tablet Take 600 mg by mouth daily.     . Multiple Vitamin tablet Take 1 tablet by mouth daily.    . nitroGLYCERIN (NITROSTAT) 0.4 MG SL tablet Place 1 tablet (0.4 mg total) under the tongue every 5 (five) minutes as needed for chest pain. 30 tablet 3  . rosuvastatin (CRESTOR) 20 MG tablet TAKE 1 TABLET (20 MG TOTAL) BY MOUTH DAILY. 30 tablet 2  . tamsulosin (FLOMAX) 0.4 MG CAPS capsule Take 1 capsule (0.4 mg total) by mouth daily. 14 capsule 0   No current facility-administered medications on file prior to visit.    Cardiovascular studies:  Abdominal Aortic Duplex 09/26/2019:  A distal abdominal aortic aneurysm measuring 2.82 x 2.82 x 2.92 cm is  seen.  Normal iliac artery velocity.  Consider rescreen  in 5-10 years. The size correlated to the CT scan on  08/15/2016 measuring 3.0x3.5cm allowing for the differences in imaging.   ABI 09/2019: This exam reveals normal perfusion of the lower extremity (ABI 1.00).  Mildly abnormal PVR waveforms bilaterally.   EKG 08/31/2019: Sinus rhythm 51 bpm. Left axis deviation. Low voltage. Poor R-wave progression. Cannot exclude old anteroseptal infarct.  Event monitor 01/04/2019 - 02/02/2019: SInus rhythm. HR 43-121 bpm. No arrhythmia noted.   Echocardiogram 11/06/2018: Normal LV systolic function with EF 68%. Left ventricle cavity is normal in size. Normal global wall motion. Doppler evidence of grade I (impaired) diastolic dysfunction, normal LAP. Calculated EF 68%. Mild (Grade I) mitral regurgitation. Normal right atrial pressure.  CT Chest 08/16/2017: 1. Lung-RADS 1, negative. Continue annual screening with low-dose chest CT without contrast in 12 months. 2. Aortic atherosclerosis (ICD10-170.0). Coronary artery calcification. 3.  Emphysema (ICD10-J43.9).  US abdomen 08/15/2017: 1. No acute findings. 2. Infrarenal abdominal aortic aneurysm measures 3.0 cm anterior-posterior by 3.5 cm right to left, without significant change from the prior CT allowing for the differences in imaging modalities, and measurement technique. Recommend followup by ultrasound in 3 years. This recommendation follows ACR consensus guidelines: White Paper of the ACR Incidental Findings Committee II on Vascular Findings. J Am Coll Radiol 2013; 24:497-530 3. Small nonobstructing stones in each kidney.  No hydronephrosis.  Recent labs: 02/16/2019: Glucose 108, BUN/Cr 11/0.9. eGFR 78.  HbA1C 5.7% Chol 158, TG 205, HDL 50, LDL 74  08/02/2017: Glucose 97, BUN/Cr 9/0.87. eGFR 86. Na/K 142/4.5. Rest of the CMP normal.  H/H 14/44. MCV 92. Platelets 193.  Chol 201, TG 196, HDL 47, LDL 115.   Review of Systems  Cardiovascular: Negative for chest pain, dyspnea on  exertion, leg swelling, palpitations and syncope.  Respiratory: Negative for shortness of breath.   Neurological: Positive for numbness (In feet. Longstanding).  All other systems reviewed and are negative.     Vitals:   11/15/19 1502  BP: 130/62  Pulse: (!) 53  SpO2: 97%    Objective:      Physical Exam Vitals and nursing note reviewed.  Constitutional:      Appearance: He is well-developed.  Neck:     Vascular: No JVD.  Cardiovascular:     Rate and Rhythm: Normal rate and regular rhythm.     Pulses:          Femoral pulses are 2+ on the right side and 3+ on the left side.      Popliteal pulses are 1+ on the right side and 1+ on the left side.       Dorsalis pedis pulses are 1+ on the right side and 1+ on the left side.       Posterior tibial pulses are 0 on the right side and 0 on the left side.     Heart sounds: Normal heart sounds. No murmur heard.   Pulmonary:     Effort: Pulmonary effort is normal.     Breath sounds: Normal breath sounds. No wheezing or rales.        Assessment & Recommendations:   74 y.o. Caucasian male with hypertension, stable AAA, aortic atherosclerosis, mixed hyperlipidemia, h/o tobacco abuse, now with syncopal episodes.  Left hip pain: Normal ABI.  Collected vascular claudication.  Likely related to arthritis.  AAA: Small aneurysm, as above.  Consider repeat ultrasound scheduled in 5-10 years  Syncope: Resolved  CAD: Given risk factors for CAD and coronary calcification, continue aspirin 81 mg daily. He is tolerating rosuvastatin and gemfibrozil without any side effects. Lipid panel improved.   Follow-up in 6 months after repeat lipid panel.  Nigel Mormon, MD [facility] Pager: [phone] Office: [phone] If no answer Cell [phone]

## 2019-12-03 DIAGNOSIS — G4700 Insomnia, unspecified: Secondary | ICD-10-CM | POA: Diagnosis not present

## 2019-12-03 DIAGNOSIS — E782 Mixed hyperlipidemia: Secondary | ICD-10-CM | POA: Diagnosis not present

## 2019-12-03 DIAGNOSIS — I10 Essential (primary) hypertension: Secondary | ICD-10-CM | POA: Diagnosis not present

## 2019-12-03 DIAGNOSIS — M179 Osteoarthritis of knee, unspecified: Secondary | ICD-10-CM | POA: Diagnosis not present

## 2019-12-10 ENCOUNTER — Other Ambulatory Visit: Payer: Self-pay | Admitting: Cardiology

## 2019-12-10 DIAGNOSIS — E782 Mixed hyperlipidemia: Secondary | ICD-10-CM

## 2020-02-13 DIAGNOSIS — E782 Mixed hyperlipidemia: Secondary | ICD-10-CM | POA: Diagnosis not present

## 2020-02-13 DIAGNOSIS — M179 Osteoarthritis of knee, unspecified: Secondary | ICD-10-CM | POA: Diagnosis not present

## 2020-02-13 DIAGNOSIS — G4700 Insomnia, unspecified: Secondary | ICD-10-CM | POA: Diagnosis not present

## 2020-02-13 DIAGNOSIS — I10 Essential (primary) hypertension: Secondary | ICD-10-CM | POA: Diagnosis not present

## 2020-03-01 ENCOUNTER — Ambulatory Visit: Payer: PPO | Attending: Internal Medicine

## 2020-03-01 DIAGNOSIS — Z23 Encounter for immunization: Secondary | ICD-10-CM

## 2020-03-01 NOTE — Progress Notes (Signed)
   Covid-19 Vaccination Clinic  Name:  William Warner    MRN: [id number] DOB: 1945/12/25  03/01/2020  William Warner was observed post Covid-19 immunization for 15 minutes without incident. He was provided with Vaccine Information Sheet and instruction to access the V-Safe system.   William Warner was instructed to call 911 with any severe reactions post vaccine: Marland Kitchen Difficulty breathing  . Swelling of face and throat  . A fast heartbeat  . A bad rash all over body  . Dizziness and weakness

## 2020-03-04 DIAGNOSIS — G4700 Insomnia, unspecified: Secondary | ICD-10-CM | POA: Diagnosis not present

## 2020-03-04 DIAGNOSIS — M179 Osteoarthritis of knee, unspecified: Secondary | ICD-10-CM | POA: Diagnosis not present

## 2020-03-04 DIAGNOSIS — E782 Mixed hyperlipidemia: Secondary | ICD-10-CM | POA: Diagnosis not present

## 2020-03-04 DIAGNOSIS — I10 Essential (primary) hypertension: Secondary | ICD-10-CM | POA: Diagnosis not present

## 2020-03-06 ENCOUNTER — Other Ambulatory Visit: Payer: Self-pay | Admitting: Cardiology

## 2020-03-06 DIAGNOSIS — E782 Mixed hyperlipidemia: Secondary | ICD-10-CM

## 2020-03-18 ENCOUNTER — Ambulatory Visit: Payer: PPO

## 2020-05-15 DIAGNOSIS — E782 Mixed hyperlipidemia: Secondary | ICD-10-CM | POA: Diagnosis not present

## 2020-05-16 LAB — LIPID PANEL
Chol/HDL Ratio: 3.2 ratio (ref 0.0–5.0)
Cholesterol, Total: 159 mg/dL (ref 100–199)
HDL: 50 mg/dL (ref >39)
LDL Chol Calc (NIH): 75 mg/dL (ref 0–99)
Triglycerides: 204 mg/dL — ABNORMAL HIGH (ref 0–149)
VLDL Cholesterol Cal: 34 mg/dL (ref 5–40)

## 2020-05-19 ENCOUNTER — Ambulatory Visit: Payer: PPO | Admitting: Cardiology

## 2020-05-19 ENCOUNTER — Encounter: Payer: Self-pay | Admitting: Cardiology

## 2020-05-19 ENCOUNTER — Other Ambulatory Visit: Payer: Self-pay

## 2020-05-19 VITALS — BP 133/55 | HR 55 | Resp 16 | Ht 70.0 in | Wt 202.0 lb

## 2020-05-19 DIAGNOSIS — E782 Mixed hyperlipidemia: Secondary | ICD-10-CM | POA: Diagnosis not present

## 2020-05-19 DIAGNOSIS — I714 Abdominal aortic aneurysm, without rupture, unspecified: Secondary | ICD-10-CM

## 2020-05-19 DIAGNOSIS — I2510 Atherosclerotic heart disease of native coronary artery without angina pectoris: Secondary | ICD-10-CM

## 2020-05-19 NOTE — Progress Notes (Signed)
Subjective:   William Warner, male    DOB: 07/22/1945, 75 y.o.   MRN: [id number]   Chief complaint:  Loss of consciousness  75 y.o. Caucasian male with hypertension, stable AAA, aortic atherosclerosis, mixed hyperlipidemia, h/o tobacco abuse  Patient denies chest pain, shortness of breath, palpitations, leg edema, orthopnea, PND, TIA/syncope. He admits to not having been physically active for the past two years.   Reviewed decent lipid panel results with the patient, details below.    Current Outpatient Medications on File Prior to Visit  Medication Sig Dispense Refill  . aspirin 81 MG tablet Take 81 mg by mouth daily.    Marland Kitchen atenolol (TENORMIN) 25 MG tablet Take 25 mg by mouth daily.    . Cholecalciferol 50 MCG (2000 UT) TBDP Take 2,000 Units by mouth daily.    . clonazePAM (KLONOPIN) 2 MG tablet Take 2 mg by mouth at bedtime.     Marland Kitchen EPINEPHrine (EPIPEN JR) 0.15 MG/0.3ML injection Inject 0.15 mg into the muscle as needed.    Marland Kitchen esomeprazole (NEXIUM) 40 MG capsule Take 40 mg by mouth daily before breakfast.    . felodipine (PLENDIL) 5 MG 24 hr tablet Take 5 mg by mouth daily.    Marland Kitchen gemfibrozil (LOPID) 600 MG tablet Take 600 mg by mouth daily.     . Multiple Vitamin tablet Take 1 tablet by mouth daily.    . nitroGLYCERIN (NITROSTAT) 0.4 MG SL tablet Place 1 tablet (0.4 mg total) under the tongue every 5 (five) minutes as needed for chest pain. 30 tablet 3  . rosuvastatin (CRESTOR) 20 MG tablet TAKE ONE TABLET BY MOUTH ONCE DAILY 30 tablet 2  . tamsulosin (FLOMAX) 0.4 MG CAPS capsule Take 1 capsule (0.4 mg total) by mouth daily. 14 capsule 0   No current facility-administered medications on file prior to visit.    Cardiovascular studies:  EKG 05/19/2020: Sinus rhythm 52 bpm Low voltage  Nonspecific T wave changes  Abdominal Aortic Duplex 09/26/2019:  A distal abdominal aortic aneurysm measuring 2.82 x 2.82 x 2.92 cm is  seen.  Normal iliac artery velocity.  Consider rescreen  in 5-10 years. The size correlated to the CT scan on  08/15/2016 measuring 3.0x3.5cm allowing for the differences in imaging.   ABI 09/2019: This exam reveals normal perfusion of the lower extremity (ABI 1.00).  Mildly abnormal PVR waveforms bilaterally.   Event monitor 01/04/2019 - 02/02/2019: SInus rhythm. HR 43-121 bpm. No arrhythmia noted.   Echocardiogram 11/06/2018: Normal LV systolic function with EF 68%. Left ventricle cavity is normal in size. Normal global wall motion. Doppler evidence of grade I (impaired) diastolic dysfunction, normal LAP. Calculated EF 68%. Mild (Grade I) mitral regurgitation. Normal right atrial pressure.  CT Chest 08/16/2017: 1. Lung-RADS 1, negative. Continue annual screening with low-dose chest CT without contrast in 12 months. 2. Aortic atherosclerosis (ICD10-170.0). Coronary artery calcification. 3.  Emphysema (ICD10-J43.9).  US abdomen 08/15/2017: 1. No acute findings. 2. Infrarenal abdominal aortic aneurysm measures 3.0 cm anterior-posterior by 3.5 cm right to left, without significant change from the prior CT allowing for the differences in imaging modalities, and measurement technique. Recommend followup by ultrasound in 3 years. This recommendation follows ACR consensus guidelines: White Paper of the ACR Incidental Findings Committee II on Vascular Findings. J Am Coll Radiol 2013; 56:213-086 3. Small nonobstructing stones in each kidney.  No hydronephrosis.  Recent labs: 05/15/2020: Chol 159, TG 204, HDL 50, LDL 75  02/16/2019: Glucose 108, BUN/Cr 11/0.9. eGFR 78.  HbA1C 5.7% Chol 158, TG 205, HDL 50, LDL 74  08/02/2017: Glucose 97, BUN/Cr 9/0.87. eGFR 86. Na/K 142/4.5. Rest of the CMP normal.  H/H 14/44. MCV 92. Platelets 193.  Chol 201, TG 196, HDL 47, LDL 115.   Review of Systems  Cardiovascular: Negative for chest pain, dyspnea on exertion, leg swelling, palpitations and syncope.  Respiratory: Negative for shortness of breath.    Neurological: Positive for numbness (In feet. Longstanding).  All other systems reviewed and are negative.     Vitals:   05/19/20 1338  BP: (!) 133/55  Pulse: (!) 55  Resp: 16  SpO2: 97%    Objective:      Physical Exam Vitals and nursing note reviewed.  Constitutional:      Appearance: He is well-developed.  Neck:     Vascular: No JVD.  Cardiovascular:     Rate and Rhythm: Normal rate and regular rhythm.     Pulses:          Femoral pulses are 2+ on the right side and 3+ on the left side.      Popliteal pulses are 1+ on the right side and 1+ on the left side.       Dorsalis pedis pulses are 1+ on the right side and 1+ on the left side.       Posterior tibial pulses are 0 on the right side and 0 on the left side.     Heart sounds: Normal heart sounds. No murmur heard.   Pulmonary:     Effort: Pulmonary effort is normal.     Breath sounds: Normal breath sounds. No wheezing or rales.        Assessment & Recommendations:   75 y.o. Caucasian male with hypertension, stable AAA, aortic atherosclerosis, mixed hyperlipidemia, h/o tobacco abuse  Hypertension: Controlled  AAA: Small aneurysm, as above.  Consider repeat ultrasound scheduled in 5-10 years  CAD: Given risk factors for CAD and coronary calcification, continue aspirin 81 mg daily.  Tolerating rosuvastatin and gemfibrozil without any side effects. Lipid panel improved.   F/u in 1 year  Nigel Mormon, MD [facility] Pager: [phone] Office: [phone] If no answer Cell [phone]

## 2020-05-19 NOTE — Progress Notes (Signed)
Error

## 2020-05-26 DIAGNOSIS — M179 Osteoarthritis of knee, unspecified: Secondary | ICD-10-CM | POA: Diagnosis not present

## 2020-05-26 DIAGNOSIS — I10 Essential (primary) hypertension: Secondary | ICD-10-CM | POA: Diagnosis not present

## 2020-05-26 DIAGNOSIS — G4700 Insomnia, unspecified: Secondary | ICD-10-CM | POA: Diagnosis not present

## 2020-05-26 DIAGNOSIS — K219 Gastro-esophageal reflux disease without esophagitis: Secondary | ICD-10-CM | POA: Diagnosis not present

## 2020-05-26 DIAGNOSIS — E782 Mixed hyperlipidemia: Secondary | ICD-10-CM | POA: Diagnosis not present

## 2020-05-30 ENCOUNTER — Other Ambulatory Visit: Payer: Self-pay | Admitting: Cardiology

## 2020-05-30 DIAGNOSIS — R0609 Other forms of dyspnea: Secondary | ICD-10-CM

## 2020-05-30 DIAGNOSIS — R0600 Dyspnea, unspecified: Secondary | ICD-10-CM

## 2020-05-30 DIAGNOSIS — E782 Mixed hyperlipidemia: Secondary | ICD-10-CM

## 2020-06-25 DIAGNOSIS — G4700 Insomnia, unspecified: Secondary | ICD-10-CM | POA: Diagnosis not present

## 2020-06-25 DIAGNOSIS — M179 Osteoarthritis of knee, unspecified: Secondary | ICD-10-CM | POA: Diagnosis not present

## 2020-06-25 DIAGNOSIS — E782 Mixed hyperlipidemia: Secondary | ICD-10-CM | POA: Diagnosis not present

## 2020-06-25 DIAGNOSIS — I10 Essential (primary) hypertension: Secondary | ICD-10-CM | POA: Diagnosis not present

## 2020-06-25 DIAGNOSIS — K219 Gastro-esophageal reflux disease without esophagitis: Secondary | ICD-10-CM | POA: Diagnosis not present

## 2020-07-25 DIAGNOSIS — G4700 Insomnia, unspecified: Secondary | ICD-10-CM | POA: Diagnosis not present

## 2020-07-25 DIAGNOSIS — M179 Osteoarthritis of knee, unspecified: Secondary | ICD-10-CM | POA: Diagnosis not present

## 2020-07-25 DIAGNOSIS — I10 Essential (primary) hypertension: Secondary | ICD-10-CM | POA: Diagnosis not present

## 2020-07-25 DIAGNOSIS — K219 Gastro-esophageal reflux disease without esophagitis: Secondary | ICD-10-CM | POA: Diagnosis not present

## 2020-07-25 DIAGNOSIS — E782 Mixed hyperlipidemia: Secondary | ICD-10-CM | POA: Diagnosis not present

## 2020-09-03 DIAGNOSIS — E782 Mixed hyperlipidemia: Secondary | ICD-10-CM | POA: Diagnosis not present

## 2020-09-03 DIAGNOSIS — G4700 Insomnia, unspecified: Secondary | ICD-10-CM | POA: Diagnosis not present

## 2020-09-03 DIAGNOSIS — K219 Gastro-esophageal reflux disease without esophagitis: Secondary | ICD-10-CM | POA: Diagnosis not present

## 2020-09-03 DIAGNOSIS — I10 Essential (primary) hypertension: Secondary | ICD-10-CM | POA: Diagnosis not present

## 2020-09-03 DIAGNOSIS — M179 Osteoarthritis of knee, unspecified: Secondary | ICD-10-CM | POA: Diagnosis not present

## 2020-09-08 DIAGNOSIS — G4700 Insomnia, unspecified: Secondary | ICD-10-CM | POA: Diagnosis not present

## 2020-09-08 DIAGNOSIS — G629 Polyneuropathy, unspecified: Secondary | ICD-10-CM | POA: Diagnosis not present

## 2020-09-08 DIAGNOSIS — I10 Essential (primary) hypertension: Secondary | ICD-10-CM | POA: Diagnosis not present

## 2020-09-08 DIAGNOSIS — R202 Paresthesia of skin: Secondary | ICD-10-CM | POA: Diagnosis not present

## 2020-09-08 DIAGNOSIS — I700 Atherosclerosis of aorta: Secondary | ICD-10-CM | POA: Diagnosis not present

## 2020-09-08 DIAGNOSIS — E559 Vitamin D deficiency, unspecified: Secondary | ICD-10-CM | POA: Diagnosis not present

## 2020-09-08 DIAGNOSIS — D81818 Other biotin-dependent carboxylase deficiency: Secondary | ICD-10-CM | POA: Diagnosis not present

## 2020-09-08 DIAGNOSIS — Z79899 Other long term (current) drug therapy: Secondary | ICD-10-CM | POA: Diagnosis not present

## 2020-09-08 DIAGNOSIS — Z1389 Encounter for screening for other disorder: Secondary | ICD-10-CM | POA: Diagnosis not present

## 2020-09-08 DIAGNOSIS — M25552 Pain in left hip: Secondary | ICD-10-CM | POA: Diagnosis not present

## 2020-09-08 DIAGNOSIS — Z0000 Encounter for general adult medical examination without abnormal findings: Secondary | ICD-10-CM | POA: Diagnosis not present

## 2020-09-08 DIAGNOSIS — R600 Localized edema: Secondary | ICD-10-CM | POA: Diagnosis not present

## 2020-09-08 DIAGNOSIS — I714 Abdominal aortic aneurysm, without rupture: Secondary | ICD-10-CM | POA: Diagnosis not present

## 2020-09-08 DIAGNOSIS — R739 Hyperglycemia, unspecified: Secondary | ICD-10-CM | POA: Diagnosis not present

## 2020-09-08 DIAGNOSIS — M179 Osteoarthritis of knee, unspecified: Secondary | ICD-10-CM | POA: Diagnosis not present

## 2020-09-08 DIAGNOSIS — Z125 Encounter for screening for malignant neoplasm of prostate: Secondary | ICD-10-CM | POA: Diagnosis not present

## 2020-09-08 DIAGNOSIS — E782 Mixed hyperlipidemia: Secondary | ICD-10-CM | POA: Diagnosis not present

## 2020-09-11 ENCOUNTER — Other Ambulatory Visit: Payer: Self-pay | Admitting: Internal Medicine

## 2020-09-15 DIAGNOSIS — Z86010 Personal history of colonic polyps: Secondary | ICD-10-CM | POA: Diagnosis not present

## 2020-09-15 DIAGNOSIS — K648 Other hemorrhoids: Secondary | ICD-10-CM | POA: Diagnosis not present

## 2020-09-15 DIAGNOSIS — D124 Benign neoplasm of descending colon: Secondary | ICD-10-CM | POA: Diagnosis not present

## 2020-09-15 DIAGNOSIS — K5730 Diverticulosis of large intestine without perforation or abscess without bleeding: Secondary | ICD-10-CM | POA: Diagnosis not present

## 2020-09-15 DIAGNOSIS — K644 Residual hemorrhoidal skin tags: Secondary | ICD-10-CM | POA: Diagnosis not present

## 2020-09-17 DIAGNOSIS — D124 Benign neoplasm of descending colon: Secondary | ICD-10-CM | POA: Diagnosis not present

## 2020-10-07 DIAGNOSIS — L812 Freckles: Secondary | ICD-10-CM | POA: Diagnosis not present

## 2020-10-07 DIAGNOSIS — G4700 Insomnia, unspecified: Secondary | ICD-10-CM | POA: Diagnosis not present

## 2020-10-07 DIAGNOSIS — L821 Other seborrheic keratosis: Secondary | ICD-10-CM | POA: Diagnosis not present

## 2020-10-07 DIAGNOSIS — K219 Gastro-esophageal reflux disease without esophagitis: Secondary | ICD-10-CM | POA: Diagnosis not present

## 2020-10-07 DIAGNOSIS — I10 Essential (primary) hypertension: Secondary | ICD-10-CM | POA: Diagnosis not present

## 2020-10-07 DIAGNOSIS — E782 Mixed hyperlipidemia: Secondary | ICD-10-CM | POA: Diagnosis not present

## 2020-10-07 DIAGNOSIS — Z85828 Personal history of other malignant neoplasm of skin: Secondary | ICD-10-CM | POA: Diagnosis not present

## 2020-10-07 DIAGNOSIS — D1801 Hemangioma of skin and subcutaneous tissue: Secondary | ICD-10-CM | POA: Diagnosis not present

## 2020-10-07 DIAGNOSIS — M179 Osteoarthritis of knee, unspecified: Secondary | ICD-10-CM | POA: Diagnosis not present

## 2020-10-07 DIAGNOSIS — L738 Other specified follicular disorders: Secondary | ICD-10-CM | POA: Diagnosis not present

## 2020-10-22 DIAGNOSIS — H5203 Hypermetropia, bilateral: Secondary | ICD-10-CM | POA: Diagnosis not present

## 2020-10-22 DIAGNOSIS — H52202 Unspecified astigmatism, left eye: Secondary | ICD-10-CM | POA: Diagnosis not present

## 2020-10-22 DIAGNOSIS — H2513 Age-related nuclear cataract, bilateral: Secondary | ICD-10-CM | POA: Diagnosis not present

## 2020-10-22 DIAGNOSIS — H25013 Cortical age-related cataract, bilateral: Secondary | ICD-10-CM | POA: Diagnosis not present

## 2020-10-22 DIAGNOSIS — H524 Presbyopia: Secondary | ICD-10-CM | POA: Diagnosis not present

## 2020-10-29 ENCOUNTER — Other Ambulatory Visit: Payer: Self-pay

## 2020-10-29 ENCOUNTER — Ambulatory Visit (INDEPENDENT_AMBULATORY_CARE_PROVIDER_SITE_OTHER)
Admission: RE | Admit: 2020-10-29 | Discharge: 2020-10-29 | Disposition: A | Discharge status: Home / Self Care | Payer: PPO | Source: Ambulatory Visit | Attending: Acute Care | Admitting: Acute Care

## 2020-10-29 DIAGNOSIS — Z87891 Personal history of nicotine dependence: Secondary | ICD-10-CM | POA: Diagnosis not present

## 2020-11-07 ENCOUNTER — Ambulatory Visit: Payer: PPO

## 2020-11-07 ENCOUNTER — Encounter: Payer: Self-pay | Admitting: *Deleted

## 2020-11-07 DIAGNOSIS — Z87891 Personal history of nicotine dependence: Secondary | ICD-10-CM

## 2020-11-07 NOTE — Progress Notes (Signed)
   Covid-19 Vaccination Clinic  Name:  ARTEZ REGIS    MRN: [id number] DOB: 02/06/1946  11/07/2020  Mr. Bukhari was observed post Covid-19 immunization for 15 minutes without incident. He was provided with Vaccine Information Sheet and instruction to access the V-Safe system.   Mr. Lindaman was instructed to call 911 with any severe reactions post vaccine: Difficulty breathing  Swelling of face and throat  A fast heartbeat  A bad rash all over body  Dizziness and weakness

## 2020-11-07 NOTE — Progress Notes (Signed)
Please call patient and let them  know their  low dose Ct was read as a Lung RADS 2: nodules that are benign in appearance and behavior with a very low likelihood of becoming a clinically active cancer due to size or lack of growth. Recommendation per radiology is for a repeat LDCT in 12 months. .Please let them  know we will order and schedule their  annual screening scan for 10/2021. Please let them  know there was notation of CAD on their  scan.  Please remind the patient  that this is a non-gated exam therefore degree or severity of disease  cannot be determined. Please have them  follow up with their PCP regarding potential risk factor modification, dietary therapy or pharmacologic therapy if clinically indicated. Pt.  is  currently on statin therapy. Please place order for annual  screening scan for  10/2021 and fax results to PCP. Thanks so much.  There was notation of CAD on the scan.He is on statin meds.

## 2020-11-10 ENCOUNTER — Other Ambulatory Visit (HOSPITAL_BASED_OUTPATIENT_CLINIC_OR_DEPARTMENT_OTHER): Payer: Self-pay

## 2020-11-10 MED ORDER — COVID-19 MRNA VAC-TRIS(PFIZER) 30 MCG/0.3ML IM SUSP
INTRAMUSCULAR | 0 refills | Status: DC
  Filled 2020-11-10: qty 0.3, 1d supply, fill #0

## 2020-12-01 DIAGNOSIS — E782 Mixed hyperlipidemia: Secondary | ICD-10-CM | POA: Diagnosis not present

## 2020-12-01 DIAGNOSIS — M179 Osteoarthritis of knee, unspecified: Secondary | ICD-10-CM | POA: Diagnosis not present

## 2020-12-01 DIAGNOSIS — K219 Gastro-esophageal reflux disease without esophagitis: Secondary | ICD-10-CM | POA: Diagnosis not present

## 2020-12-01 DIAGNOSIS — G4700 Insomnia, unspecified: Secondary | ICD-10-CM | POA: Diagnosis not present

## 2020-12-01 DIAGNOSIS — I10 Essential (primary) hypertension: Secondary | ICD-10-CM | POA: Diagnosis not present

## 2020-12-22 ENCOUNTER — Other Ambulatory Visit (HOSPITAL_COMMUNITY): Payer: Self-pay

## 2021-05-05 ENCOUNTER — Ambulatory Visit
Admission: EM | Admit: 2021-05-05 | Discharge: 2021-05-05 | Disposition: A | Discharge status: Home / Self Care | Payer: PPO | Attending: Physician Assistant | Admitting: Physician Assistant

## 2021-05-05 ENCOUNTER — Encounter: Payer: Self-pay | Admitting: Emergency Medicine

## 2021-05-05 ENCOUNTER — Other Ambulatory Visit: Payer: Self-pay

## 2021-05-05 DIAGNOSIS — J069 Acute upper respiratory infection, unspecified: Secondary | ICD-10-CM

## 2021-05-05 NOTE — ED Triage Notes (Signed)
Patient states that he began running a slight fever, nasal drainage, sore throat x 2 days.  Patient has taken Tylenol, slight headache.  Home COVID test was invalid x 2.

## 2021-05-05 NOTE — ED Provider Notes (Signed)
[facility]    CSN: [id number] Arrival date & time: 05/05/21  1353      History   Chief Complaint Chief Complaint  Patient presents with   Fever    HPI PLATO ALSPAUGH is a 75 y.o. male.   Patient here today for evaluation of nasal congestion and drainage, sore throat and fever that started 3 days ago. He reports he has had Tmax of 100.30F. He has had slight headache. He has tried tylenol, warm salt gargles without resolution.   The history is provided by the patient.  Fever Associated symptoms: congestion, cough and sore throat   Associated symptoms: no chills, no diarrhea, no ear pain, no nausea and no vomiting    Past Medical History:  Diagnosis Date   AAA (abdominal aortic aneurysm)    Allergy    B12 deficiency    DJD (degenerative joint disease)    DJD (degenerative joint disease)    DOE (dyspnea on exertion)    Erectile dysfunction    GERD (gastroesophageal reflux disease)    Hearing loss    Hemorrhagic gastritis    Hx of colonoscopy 2007   Hyperlipidemia    Hypertension    Insomnia    Kidney stone 08/2010   Lumbago    Other B-complex deficiencies    Peripheral vascular disease ([place])    PVD (peripheral vascular disease) ([place])    Renal calculi    Vitamin D deficiency     Patient Active Problem List   Diagnosis Date Noted   AAA (abdominal aortic aneurysm) without rupture 08/31/2019   Syncope and collapse 01/04/2019   Former smoker 08/29/2018   Coronary artery disease involving native coronary artery of native heart without angina pectoris 08/29/2018   Mixed hyperlipidemia 08/29/2018   DOE (dyspnea on exertion)    Claudication in peripheral vascular disease ([place])    DJD (degenerative joint disease)    RUQ abdominal pain 01/06/2012    Past Surgical History:  Procedure Laterality Date   BASAL CELL CARCINOMA EXCISION  09/2019   COLONOSCOPY     KNEE SURGERY     TONSILLECTOMY         Home Medications    Prior to Admission  medications   Medication Sig Start Date End Date Taking? Authorizing Provider  aspirin 81 MG tablet Take 81 mg by mouth daily.   Yes [provider]  atenolol (TENORMIN) 25 MG tablet Take 25 mg by mouth daily.   Yes [provider]  Cholecalciferol 50 MCG (2000 UT) TBDP Take 2,000 Units by mouth daily.   Yes [provider]  clonazePAM (KLONOPIN) 2 MG tablet Take 2 mg by mouth at bedtime.    Yes [provider]  COVID-19 mRNA Vac-TriS, Pfizer, SUSP injection Inject into the muscle. 11/07/20  Yes Carlyle Basques, MD  EPINEPHrine [facility] JR) 0.15 MG/0.3ML injection Inject 0.15 mg into the muscle as needed.   Yes [provider]  esomeprazole (NEXIUM) 40 MG capsule Take 40 mg by mouth daily before breakfast.   Yes [provider]  felodipine (PLENDIL) 5 MG 24 hr tablet Take 5 mg by mouth daily.   Yes [provider]  gemfibrozil (LOPID) 600 MG tablet Take 600 mg by mouth daily.    Yes [provider]  Multiple Vitamin tablet Take 1 tablet by mouth daily.   Yes [provider]  rosuvastatin (CRESTOR) 20 MG tablet TAKE ONE TABLET BY MOUTH ONCE DAILY 06/04/20  Yes Patwardhan, Reynold Bowen, MD  tamsulosin (FLOMAX) 0.4 MG CAPS capsule Take 1 capsule (0.4 mg total) by mouth daily. 06/25/14  Yes Mabe, Forbes Cellar, MD  nitroGLYCERIN (NITROSTAT) 0.4 MG SL tablet PLACE 1 TABLET (0.4 MG TOTAL) UNDER THE TONGUE EVERY 5 (FIVE) MINUTES AS NEEDED FOR CHEST PAIN. 06/04/20 09/02/20  Nigel Mormon, MD    Family History Family History  Problem Relation Age of Onset   Lung cancer Mother    Peripheral vascular disease Father     Social History Social History   Tobacco Use   Smoking status: Former    Packs/day: 1.00    Years: 54.00    Pack years: 54.00    Types: Cigarettes    Quit date: 05/17/2016    Years since quitting: 4.9   Smokeless tobacco: Never  Vaping Use   Vaping Use: Never used  Substance Use Topics   Alcohol use: No     Alcohol/week: 0.0 standard drinks    Comment: Quit 2014, drank heavily before that    Drug use: No     Allergies   Bee venom and Augmentin [amoxicillin-pot clavulanate]   Review of Systems Review of Systems  Constitutional:  Positive for diaphoresis and fever. Negative for chills.  HENT:  Positive for congestion and sore throat. Negative for ear pain.   Eyes:  Negative for discharge and redness.  Respiratory:  Positive for cough. Negative for shortness of breath.   Gastrointestinal:  Negative for abdominal pain, diarrhea, nausea and vomiting.    Physical Exam Triage Vital Signs ED Triage Vitals  Enc Vitals Group     BP 05/05/21 1412 138/72     Pulse Rate 05/05/21 1412 61     Resp --      Temp 05/05/21 1412 98.1 F (36.7 C)     Temp Source 05/05/21 1412 Oral     SpO2 05/05/21 1412 95 %     Weight 05/05/21 1413 201 lb 15.1 oz (91.6 kg)     Height 05/05/21 1413 5\' 10"  (1.778 m)     Head Circumference --      Peak Flow --      Pain Score 05/05/21 1412 4     Pain Loc --      Pain Edu? --      Excl. in [place]? --    No data found.  Updated Vital Signs BP 138/72 (BP Location: Left Arm)    Pulse 61    Temp 98.1 F (36.7 C) (Oral)    Ht 5\' 10"  (1.778 m)    Wt 201 lb 15.1 oz (91.6 kg)    SpO2 95%    BMI 28.98 kg/m     Physical Exam Vitals and nursing note reviewed.  Constitutional:      General: He is not in acute distress.    Appearance: Normal appearance. He is not ill-appearing.  HENT:     Head: Normocephalic and atraumatic.     Nose: Congestion present.     Mouth/Throat:     Mouth: Mucous membranes are moist.     Pharynx: Oropharynx is clear. Posterior oropharyngeal erythema present. No oropharyngeal exudate.  Eyes:     Conjunctiva/sclera: Conjunctivae normal.  Cardiovascular:     Rate and Rhythm: Normal rate and regular rhythm.     Heart sounds: Normal heart sounds. No murmur heard. Pulmonary:     Effort: Pulmonary effort is normal. No respiratory distress.      Breath sounds: Normal breath sounds. No wheezing, rhonchi or rales.  Skin:  General: Skin is warm and dry.  Neurological:     Mental Status: He is alert.  Psychiatric:        Mood and Affect: Mood normal.        Thought Content: Thought content normal.     UC Treatments / Results  Labs (all labs ordered are listed, but only abnormal results are displayed) Labs Reviewed  COVID-19, FLU A+B AND RSV    EKG   Radiology No results found.  Procedures Procedures (including critical care time)  Medications Ordered in UC Medications - No data to display  Initial Impression / Assessment and Plan / UC Course  I have reviewed the triage vital signs and the nursing notes.  Pertinent labs & imaging results that were available during my care of the patient were reviewed by me and considered in my medical decision making (see chart for details).    Suspect likely viral etiology of symptoms. Covid, Flu and RSV screening ordered. Will await results for further recommendation. Recommend follow up if no improvement or if symptoms worsen.   Final Clinical Impressions(s) / UC Diagnoses   Final diagnoses:  Acute upper respiratory infection   Discharge Instructions   None    ED Prescriptions   None    PDMP not reviewed this encounter.   Francene Finders, PA-C 05/05/21 1527

## 2021-05-06 LAB — COVID-19, FLU A+B AND RSV
Influenza A, NAA: NOT DETECTED
Influenza B, NAA: NOT DETECTED
RSV, NAA: NOT DETECTED
SARS-CoV-2, NAA: DETECTED — AB

## 2021-05-19 NOTE — Progress Notes (Signed)
Subjective:   William Warner, male    DOB: July 13, 1945, 76 y.o.   MRN: [id number]   Chief complaint:  Loss of consciousness  76 y.o. Caucasian male with hypertension, stable AAA, aortic atherosclerosis, mixed hyperlipidemia, h/o tobacco abuse  Patient had COVID before christmas with fever and sore throat being primary symptoms. He has recovered and wants to start exercising. He has had exertional dyspnea even prior to COVID. He denies any chest pain.   Current Outpatient Medications on File Prior to Visit  Medication Sig Dispense Refill   aspirin 81 MG tablet Take 81 mg by mouth daily.     atenolol (TENORMIN) 25 MG tablet Take 25 mg by mouth daily.     Cholecalciferol 50 MCG (2000 UT) TBDP Take 2,000 Units by mouth daily.     clonazePAM (KLONOPIN) 2 MG tablet Take 2 mg by mouth at bedtime.      COVID-19 mRNA Vac-TriS, Pfizer, SUSP injection Inject into the muscle. 0.3 mL 0   EPINEPHrine (EPIPEN JR) 0.15 MG/0.3ML injection Inject 0.15 mg into the muscle as needed.     esomeprazole (NEXIUM) 40 MG capsule Take 40 mg by mouth daily before breakfast.     felodipine (PLENDIL) 5 MG 24 hr tablet Take 5 mg by mouth daily.     gemfibrozil (LOPID) 600 MG tablet Take 600 mg by mouth daily.      Multiple Vitamin tablet Take 1 tablet by mouth daily.     nitroGLYCERIN (NITROSTAT) 0.4 MG SL tablet PLACE 1 TABLET (0.4 MG TOTAL) UNDER THE TONGUE EVERY 5 (FIVE) MINUTES AS NEEDED FOR CHEST PAIN. 25 tablet 3   rosuvastatin (CRESTOR) 20 MG tablet TAKE ONE TABLET BY MOUTH ONCE DAILY 90 tablet 2   tamsulosin (FLOMAX) 0.4 MG CAPS capsule Take 1 capsule (0.4 mg total) by mouth daily. 14 capsule 0   No current facility-administered medications on file prior to visit.    Cardiovascular studies:  EKG 05/20/2021: Sinus rhythm 54 bpm LAFB  Abdominal Aortic Duplex  09/26/2019:  A distal abdominal aortic aneurysm measuring 2.82 x 2.82 x 2.92 cm is  seen.  Normal iliac artery velocity.  Consider rescreen  in 5-10 years. The size correlated to the CT scan on  08/15/2016 measuring 3.0x3.5cm allowing for the differences in imaging.   ABI 09/2019: This exam reveals normal perfusion of the lower extremity (ABI 1.00).  Mildly abnormal PVR waveforms bilaterally.   Event monitor 01/04/2019 - 02/02/2019: SInus rhythm. HR 43-121 bpm. No arrhythmia noted.   Echocardiogram 11/06/2018: Normal LV systolic function with EF 68%. Left ventricle cavity is normal in size. Normal global wall motion. Doppler evidence of grade I (impaired) diastolic dysfunction, normal LAP. Calculated EF 68%. Mild (Grade I) mitral regurgitation. Normal right atrial pressure.  CT Chest 08/16/2017: 1. Lung-RADS 1, negative. Continue annual screening with low-dose chest CT without contrast in 12 months. 2. Aortic atherosclerosis (ICD10-170.0). Coronary artery calcification. 3.  Emphysema (ICD10-J43.9).  US abdomen 08/15/2017: 1. No acute findings. 2. Infrarenal abdominal aortic aneurysm measures 3.0 cm anterior-posterior by 3.5 cm right to left, without significant change from the prior CT allowing for the differences in imaging modalities, and measurement technique. Recommend followup by ultrasound in 3 years. This recommendation follows ACR consensus guidelines: White Paper of the ACR Incidental Findings Committee II on Vascular Findings. J Am Coll Radiol 2013; 84:665-993 3. Small nonobstructing stones in each kidney.  No hydronephrosis.  Recent labs: 09/08/2020: Glucose 123, BUN/Cr 12/0.7. EGFR 93. HbA1C 5.9% Chol 136,  TG 187, HDL 47, LDL 59 TSH 1.1 normal  05/15/2020: Chol 159, TG 204, HDL 50, LDL 75  02/16/2019: Glucose 108, BUN/Cr 11/0.9. eGFR 78.  HbA1C 5.7% Chol 158, TG 205, HDL 50, LDL 74  08/02/2017: Glucose 97, BUN/Cr 9/0.87. eGFR 86. Na/K 142/4.5. Rest of the CMP normal.  H/H 14/44. MCV 92. Platelets 193.  Chol 201, TG 196, HDL 47, LDL 115.   Review of Systems  Cardiovascular:  Positive for dyspnea  on exertion. Negative for chest pain, leg swelling, palpitations and syncope.  Respiratory:  Negative for shortness of breath.   Neurological:  Positive for numbness (In feet. Longstanding).  All other systems reviewed and are negative.    Vitals:   05/20/21 1426  BP: 138/61  Pulse: (!) 55  Resp: 16  Temp: 97.8 F (36.6 C)  SpO2: 96%    Objective:      Physical Exam Vitals and nursing note reviewed.  Constitutional:      Appearance: He is well-developed.  Neck:     Vascular: No JVD.  Cardiovascular:     Rate and Rhythm: Normal rate and regular rhythm.     Pulses:          Femoral pulses are 2+ on the right side and 3+ on the left side.      Popliteal pulses are 1+ on the right side and 1+ on the left side.       Dorsalis pedis pulses are 1+ on the right side and 1+ on the left side.       Posterior tibial pulses are 0 on the right side and 0 on the left side.     Heart sounds: Normal heart sounds. No murmur heard. Pulmonary:     Effort: Pulmonary effort is normal.     Breath sounds: Normal breath sounds. No wheezing or rales.  Musculoskeletal:     Right lower leg: No edema.     Left lower leg: No edema.       Assessment & Recommendations:   76 y.o. Caucasian male with hypertension, stable AAA, aortic atherosclerosis, mixed hyperlipidemia, h/o tobacco abuse  Exertional dyspnea: Known coronary calcification. Will check echocardiogram and lexiscan nuclear stress test  Hypertension: Controlled  AAA: Small aneurysm, as above.  Consider repeat ultrasound scheduled in 5-10 years  CAD: Given risk factors for CAD and coronary calcification, continue aspirin 81 mg daily.  Tolerating rosuvastatin and gemfibrozil without any side effects. Lipids well controlled.  F/u after tests  Nigel Mormon, MD [facility] Pager: [phone] Office: [phone] If no answer Cell [phone]

## 2021-05-20 ENCOUNTER — Other Ambulatory Visit: Payer: Self-pay

## 2021-05-20 ENCOUNTER — Encounter: Payer: Self-pay | Admitting: Cardiology

## 2021-05-20 ENCOUNTER — Ambulatory Visit: Payer: Medicare HMO | Admitting: Cardiology

## 2021-05-20 VITALS — BP 138/61 | HR 55 | Temp 97.8°F | Resp 16 | Ht 70.0 in | Wt 196.0 lb

## 2021-05-20 DIAGNOSIS — I2510 Atherosclerotic heart disease of native coronary artery without angina pectoris: Secondary | ICD-10-CM

## 2021-05-20 DIAGNOSIS — R0609 Other forms of dyspnea: Secondary | ICD-10-CM

## 2021-05-20 DIAGNOSIS — I25118 Atherosclerotic heart disease of native coronary artery with other forms of angina pectoris: Secondary | ICD-10-CM

## 2021-05-20 DIAGNOSIS — E782 Mixed hyperlipidemia: Secondary | ICD-10-CM | POA: Diagnosis not present

## 2021-05-21 ENCOUNTER — Other Ambulatory Visit: Payer: Self-pay | Admitting: Cardiology

## 2021-05-21 DIAGNOSIS — E782 Mixed hyperlipidemia: Secondary | ICD-10-CM

## 2021-05-26 ENCOUNTER — Ambulatory Visit: Payer: Medicare HMO

## 2021-05-26 ENCOUNTER — Other Ambulatory Visit: Payer: Self-pay

## 2021-05-26 DIAGNOSIS — I25118 Atherosclerotic heart disease of native coronary artery with other forms of angina pectoris: Secondary | ICD-10-CM | POA: Diagnosis not present

## 2021-05-29 ENCOUNTER — Ambulatory Visit: Payer: PPO | Attending: Internal Medicine

## 2021-05-29 DIAGNOSIS — Z23 Encounter for immunization: Secondary | ICD-10-CM

## 2021-06-01 ENCOUNTER — Other Ambulatory Visit (HOSPITAL_BASED_OUTPATIENT_CLINIC_OR_DEPARTMENT_OTHER): Payer: Self-pay

## 2021-06-01 MED ORDER — PFIZER COVID-19 VAC BIVALENT 30 MCG/0.3ML IM SUSP
INTRAMUSCULAR | 0 refills | Status: DC
  Filled 2021-06-01: qty 0.3, 1d supply, fill #0

## 2021-06-02 NOTE — Progress Notes (Signed)
° °  Covid-19 Vaccination Clinic  Name:  William Warner    MRN: [id number] DOB: 1946/02/16  06/02/2021  [name] was observed post Covid-19 immunization for 15 minutes without incident. He was provided with Vaccine Information Sheet and instruction to access the V-Safe system.   [name] was instructed to call 911 with any severe reactions post vaccine: Difficulty breathing  Swelling of face and throat  A fast heartbeat  A bad rash all over body  Dizziness and weakness   Immunizations Administered     Name Date Dose VIS Date Route   Pfizer Covid-19 Vaccine Bivalent Booster 05/29/2021  2:35 PM 0.3 mL 01/14/2021 Intramuscular   Manufacturer: [place]   Lot: [id number]   [place]: [phone]

## 2021-06-10 ENCOUNTER — Other Ambulatory Visit: Payer: Medicare HMO

## 2021-06-22 ENCOUNTER — Ambulatory Visit: Payer: Medicare HMO | Admitting: Cardiology

## 2021-06-24 ENCOUNTER — Other Ambulatory Visit: Payer: Self-pay | Admitting: Cardiology

## 2021-06-24 DIAGNOSIS — R0609 Other forms of dyspnea: Secondary | ICD-10-CM

## 2021-06-29 ENCOUNTER — Ambulatory Visit: Payer: Medicare HMO

## 2021-06-29 ENCOUNTER — Other Ambulatory Visit: Payer: Self-pay

## 2021-06-29 DIAGNOSIS — I25118 Atherosclerotic heart disease of native coronary artery with other forms of angina pectoris: Secondary | ICD-10-CM

## 2021-06-30 DIAGNOSIS — R9439 Abnormal result of other cardiovascular function study: Secondary | ICD-10-CM | POA: Insufficient documentation

## 2021-06-30 NOTE — Progress Notes (Signed)
Subjective:   William Warner, male    DOB: [birth year], 76 y.o.   MRN: [id number]   Chief complaint:  Loss of consciousness  76 y.o. Caucasian male with hypertension, stable AAA, aortic atherosclerosis, mixed hyperlipidemia, h/o tobacco abuse  Patient had dyspnea on uphill walking during nuclear stress test. Reviewed recent test results with the patient, details below. However, he is able to walk up to 20 min on flat gradient on treadmill at home without significant dyspnea. His biggest complaint right now is lower back pain.    Current Outpatient Medications on File Prior to Visit  Medication Sig Dispense Refill   aspirin 81 MG tablet Take 81 mg by mouth daily.     atenolol (TENORMIN) 25 MG tablet Take 25 mg by mouth daily.     Cholecalciferol 50 MCG (2000 UT) TBDP Take 2,000 Units by mouth daily.     clonazePAM (KLONOPIN) 2 MG tablet Take 2 mg by mouth at bedtime.      COVID-19 mRNA bivalent vaccine, Pfizer, (PFIZER COVID-19 VAC BIVALENT) injection Inject into the muscle. 0.3 mL 0   COVID-19 mRNA Vac-TriS, Pfizer, SUSP injection Inject into the muscle. 0.3 mL 0   EPINEPHrine (EPIPEN JR) 0.15 MG/0.3ML injection Inject 0.15 mg into the muscle as needed.     esomeprazole (NEXIUM) 40 MG capsule Take 40 mg by mouth daily before breakfast.     felodipine (PLENDIL) 5 MG 24 hr tablet Take 5 mg by mouth daily.     gemfibrozil (LOPID) 600 MG tablet Take 600 mg by mouth daily.      Multiple Vitamin tablet Take 1 tablet by mouth daily.     nitroGLYCERIN (NITROSTAT) 0.4 MG SL tablet DISSOLVE 1 TABLET UNDER THE TONGUE EVERY 5 MINUTES AS NEEDED FOR CHEST PAIN. DO NOT EXCEED A TOTAL OF 3 DOSES IN 15 MINUTES. 25 tablet 3   rosuvastatin (CRESTOR) 20 MG tablet TAKE ONE TABLET BY MOUTH ONCE DAILY 90 tablet 2   tamsulosin (FLOMAX) 0.4 MG CAPS capsule Take 1 capsule (0.4 mg total) by mouth daily. 14 capsule 0   No current facility-administered medications on file prior to visit.    Cardiovascular  studies:  Exercise/Lexican Tetrofosmin stress test 06/29/2021: Exercise nuclear stress test was performed using Bruce protocol, with injection of IV Lexiscan due to inadequate heart rate response. Patient reached 7 METS, and 79% of age predicted maximum heart rate. Exercise capacity was fair. No chest pain reported. Dyspnea reported. Suboptimal heart rate response. Lower blood pressure noted post injection. Stress EKG revealed no ischemic changes. SPECT images show medium sized, severe intensity, reversible perfusion defect in mid to apical, inferolateral myocardium. Stress LVEF 68%. Intermediate risk study.   Echocardiogram 05/26/2021:  Normal LV systolic function with visual EF 60-65%. Left ventricle cavity  is normal in size. Normal left ventricular wall thickness. Normal global  wall motion. Normal diastolic filling pattern, normal LAP.  Mild pulmonic regurgitation.  Trivial pericardial effusion. There is no hemodynamic significance.  Compared to study 11/06/2018: Mild PR and trivial pericardial effusion are  new finding; otherwise, no significant change.   EKG 05/20/2021: Sinus rhythm 54 bpm LAFB  Abdominal Aortic Duplex  09/26/2019:  A distal abdominal aortic aneurysm measuring 2.82 x 2.82 x 2.92 cm is  seen.  Normal iliac artery velocity.  Consider rescreen in 5-10 years. The size correlated to the CT scan on  08/15/2016 measuring 3.0x3.5cm allowing for the differences in imaging.   ABI 09/2019: This exam reveals normal perfusion of  the lower extremity (ABI 1.00).  Mildly abnormal PVR waveforms bilaterally.   Event monitor 01/04/2019 - 02/02/2019: SInus rhythm. HR 43-121 bpm. No arrhythmia noted.   Echocardiogram 11/06/2018: Normal LV systolic function with EF 68%. Left ventricle cavity is normal in size. Normal global wall motion. Doppler evidence of grade I (impaired) diastolic dysfunction, normal LAP. Calculated EF 68%. Mild (Grade I) mitral regurgitation. Normal right atrial  pressure.  CT Chest 08/16/2017: 1. Lung-RADS 1, negative. Continue annual screening with low-dose chest CT without contrast in 12 months. 2. Aortic atherosclerosis (ICD10-170.0). Coronary artery calcification. 3.  Emphysema (ICD10-J43.9).  US abdomen 08/15/2017: 1. No acute findings. 2. Infrarenal abdominal aortic aneurysm measures 3.0 cm anterior-posterior by 3.5 cm right to left, without significant change from the prior CT allowing for the differences in imaging modalities, and measurement technique. Recommend followup by ultrasound in 3 years. This recommendation follows ACR consensus guidelines: White Paper of the ACR Incidental Findings Committee II on Vascular Findings. J Am Coll Radiol 2013; 95:621-308 3. Small nonobstructing stones in each kidney.  No hydronephrosis.  Recent labs: 09/08/2020: Glucose 123, BUN/Cr 12/0.7. EGFR 93. HbA1C 5.9% Chol 136, TG 187, HDL 47, LDL 59 TSH 1.1 normal  05/15/2020: Chol 159, TG 204, HDL 50, LDL 75  02/16/2019: Glucose 108, BUN/Cr 11/0.9. eGFR 78.  HbA1C 5.7% Chol 158, TG 205, HDL 50, LDL 74  08/02/2017: Glucose 97, BUN/Cr 9/0.87. eGFR 86. Na/K 142/4.5. Rest of the CMP normal.  H/H 14/44. MCV 92. Platelets 193.  Chol 201, TG 196, HDL 47, LDL 115.   Review of Systems  Cardiovascular:  Positive for dyspnea on exertion. Negative for chest pain, leg swelling, palpitations and syncope.  Respiratory:  Negative for shortness of breath.   Musculoskeletal:  Positive for back pain.  Neurological:  Positive for numbness (In feet. Longstanding).  All other systems reviewed and are negative.    Vitals:   07/06/21 1359  BP: 127/72  Pulse: 61  Resp: 17  Temp: 98 F (36.7 C)  SpO2: 94%    Objective:      Physical Exam Vitals and nursing note reviewed.  Constitutional:      Appearance: He is well-developed.  Neck:     Vascular: No JVD.  Cardiovascular:     Rate and Rhythm: Normal rate and regular rhythm.     Pulses:           Femoral pulses are 2+ on the right side and 3+ on the left side.      Popliteal pulses are 1+ on the right side and 1+ on the left side.       Dorsalis pedis pulses are 1+ on the right side and 1+ on the left side.       Posterior tibial pulses are 0 on the right side and 0 on the left side.     Heart sounds: Normal heart sounds. No murmur heard. Pulmonary:     Effort: Pulmonary effort is normal.     Breath sounds: Normal breath sounds. No wheezing or rales.  Musculoskeletal:     Right lower leg: No edema.     Left lower leg: No edema.       Assessment & Recommendations:   76 y.o. Caucasian male with hypertension, stable AAA, aortic atherosclerosis, mixed hyperlipidemia, h/o tobacco abuse  Exertional dyspnea: Known coronary calcification. Possible angina equivalent. Intermediate risk stress test with medium sized, severe intensity, reversible perfusion defect in mid to apical, inferolateral myocardium.  Given his relatively controlled  symptoms and no lifestyle limiting dyspnea, okay to continue medical management for now. If symptoms worsen, would recommend coronary angiography and possible intervention.  Currently on Aspirin, Crestor, felodipine, atenolol.  Hypertension: Controlled  AAA: Small aneurysm, as above.  Consider repeat ultrasound scheduled in 5-10 years  F/u in 3 months  [place], MD [facility] [place] Pager: [phone] Office: [phone] If no answer Cell [phone]

## 2021-07-06 ENCOUNTER — Ambulatory Visit: Payer: Medicare HMO | Admitting: Cardiology

## 2021-07-06 ENCOUNTER — Encounter: Payer: Self-pay | Admitting: Cardiology

## 2021-07-06 ENCOUNTER — Other Ambulatory Visit: Payer: Self-pay

## 2021-07-06 VITALS — BP 127/72 | HR 61 | Temp 98.0°F | Resp 17 | Ht 70.0 in | Wt 196.0 lb

## 2021-07-06 DIAGNOSIS — R9439 Abnormal result of other cardiovascular function study: Secondary | ICD-10-CM | POA: Diagnosis not present

## 2021-07-06 DIAGNOSIS — R0609 Other forms of dyspnea: Secondary | ICD-10-CM | POA: Diagnosis not present

## 2021-07-06 DIAGNOSIS — E782 Mixed hyperlipidemia: Secondary | ICD-10-CM

## 2021-07-06 DIAGNOSIS — I25118 Atherosclerotic heart disease of native coronary artery with other forms of angina pectoris: Secondary | ICD-10-CM | POA: Diagnosis not present

## 2021-07-14 DIAGNOSIS — E782 Mixed hyperlipidemia: Secondary | ICD-10-CM | POA: Diagnosis not present

## 2021-07-14 DIAGNOSIS — I10 Essential (primary) hypertension: Secondary | ICD-10-CM | POA: Diagnosis not present

## 2021-10-07 ENCOUNTER — Other Ambulatory Visit: Payer: Self-pay | Admitting: Internal Medicine

## 2021-10-07 ENCOUNTER — Ambulatory Visit
Admission: RE | Admit: 2021-10-07 | Discharge: 2021-10-07 | Disposition: A | Discharge status: Home / Self Care | Payer: Medicare HMO | Source: Ambulatory Visit | Attending: Internal Medicine | Admitting: Internal Medicine

## 2021-10-07 ENCOUNTER — Ambulatory Visit: Payer: Medicare HMO | Admitting: Cardiology

## 2021-10-07 DIAGNOSIS — M48061 Spinal stenosis, lumbar region without neurogenic claudication: Secondary | ICD-10-CM

## 2021-10-07 DIAGNOSIS — R3129 Other microscopic hematuria: Secondary | ICD-10-CM | POA: Diagnosis not present

## 2021-10-07 DIAGNOSIS — Z125 Encounter for screening for malignant neoplasm of prostate: Secondary | ICD-10-CM | POA: Diagnosis not present

## 2021-10-07 DIAGNOSIS — R7309 Other abnormal glucose: Secondary | ICD-10-CM | POA: Diagnosis not present

## 2021-10-07 DIAGNOSIS — M545 Low back pain, unspecified: Secondary | ICD-10-CM | POA: Diagnosis not present

## 2021-10-07 DIAGNOSIS — Z8042 Family history of malignant neoplasm of prostate: Secondary | ICD-10-CM | POA: Diagnosis not present

## 2021-10-07 DIAGNOSIS — I10 Essential (primary) hypertension: Secondary | ICD-10-CM | POA: Diagnosis not present

## 2021-10-07 DIAGNOSIS — K219 Gastro-esophageal reflux disease without esophagitis: Secondary | ICD-10-CM | POA: Diagnosis not present

## 2021-10-07 DIAGNOSIS — I714 Abdominal aortic aneurysm, without rupture, unspecified: Secondary | ICD-10-CM | POA: Diagnosis not present

## 2021-10-07 DIAGNOSIS — R7303 Prediabetes: Secondary | ICD-10-CM | POA: Diagnosis not present

## 2021-10-09 DIAGNOSIS — N200 Calculus of kidney: Secondary | ICD-10-CM | POA: Diagnosis not present

## 2021-10-09 DIAGNOSIS — I714 Abdominal aortic aneurysm, without rupture, unspecified: Secondary | ICD-10-CM | POA: Diagnosis not present

## 2021-10-09 DIAGNOSIS — R3129 Other microscopic hematuria: Secondary | ICD-10-CM | POA: Diagnosis not present

## 2021-10-13 DIAGNOSIS — D225 Melanocytic nevi of trunk: Secondary | ICD-10-CM | POA: Diagnosis not present

## 2021-10-13 DIAGNOSIS — Z85828 Personal history of other malignant neoplasm of skin: Secondary | ICD-10-CM | POA: Diagnosis not present

## 2021-10-13 DIAGNOSIS — D2272 Melanocytic nevi of left lower limb, including hip: Secondary | ICD-10-CM | POA: Diagnosis not present

## 2021-10-13 DIAGNOSIS — L570 Actinic keratosis: Secondary | ICD-10-CM | POA: Diagnosis not present

## 2021-10-13 DIAGNOSIS — L821 Other seborrheic keratosis: Secondary | ICD-10-CM | POA: Diagnosis not present

## 2021-10-16 ENCOUNTER — Ambulatory Visit: Payer: Medicare HMO | Admitting: Cardiology

## 2021-10-16 ENCOUNTER — Encounter: Payer: Self-pay | Admitting: Cardiology

## 2021-10-16 VITALS — BP 131/62 | HR 54 | Temp 98.6°F | Resp 17 | Ht 70.0 in | Wt 192.0 lb

## 2021-10-16 DIAGNOSIS — R0609 Other forms of dyspnea: Secondary | ICD-10-CM | POA: Diagnosis not present

## 2021-10-16 DIAGNOSIS — I25118 Atherosclerotic heart disease of native coronary artery with other forms of angina pectoris: Secondary | ICD-10-CM | POA: Diagnosis not present

## 2021-10-16 DIAGNOSIS — E782 Mixed hyperlipidemia: Secondary | ICD-10-CM

## 2021-10-16 NOTE — Progress Notes (Signed)
Subjective:   William Warner, male    DOB: 1946/05/11, 76 y.o.   MRN: [id number]   Chief complaint:  Loss of consciousness  76 y.o. Caucasian male with hypertension, stable AAA, aortic atherosclerosis, mixed hyperlipidemia, h/o tobacco abuse  Patient's primary complaint remains back pain. He denies any over chest pain or dyspnea. Blood pressure is well controlled. Reviewed recent test results with the patient, details below.    Current Outpatient Medications:    aspirin 81 MG tablet, Take 81 mg by mouth daily., Disp: , Rfl:    atenolol (TENORMIN) 25 MG tablet, Take 25 mg by mouth daily., Disp: , Rfl:    Cholecalciferol 50 MCG (2000 UT) TBDP, Take 2,000 Units by mouth daily., Disp: , Rfl:    clonazePAM (KLONOPIN) 2 MG tablet, Take 2 mg by mouth at bedtime. , Disp: , Rfl:    COVID-19 mRNA bivalent vaccine, Pfizer, (PFIZER COVID-19 VAC BIVALENT) injection, Inject into the muscle., Disp: 0.3 mL, Rfl: 0   COVID-19 mRNA Vac-TriS, Pfizer, SUSP injection, Inject into the muscle., Disp: 0.3 mL, Rfl: 0   EPINEPHrine (EPIPEN JR) 0.15 MG/0.3ML injection, Inject 0.15 mg into the muscle as needed., Disp: , Rfl:    esomeprazole (NEXIUM) 40 MG capsule, Take 40 mg by mouth daily before breakfast., Disp: , Rfl:    felodipine (PLENDIL) 5 MG 24 hr tablet, Take 5 mg by mouth daily., Disp: , Rfl:    gemfibrozil (LOPID) 600 MG tablet, Take 600 mg by mouth daily. , Disp: , Rfl:    Multiple Vitamin tablet, Take 1 tablet by mouth daily., Disp: , Rfl:    nitroGLYCERIN (NITROSTAT) 0.4 MG SL tablet, DISSOLVE 1 TABLET UNDER THE TONGUE EVERY 5 MINUTES AS NEEDED FOR CHEST PAIN. DO NOT EXCEED A TOTAL OF 3 DOSES IN 15 MINUTES., Disp: 25 tablet, Rfl: 3   rosuvastatin (CRESTOR) 20 MG tablet, TAKE ONE TABLET BY MOUTH ONCE DAILY, Disp: 90 tablet, Rfl: 2   tamsulosin (FLOMAX) 0.4 MG CAPS capsule, Take 1 capsule (0.4 mg total) by mouth daily., Disp: 14 capsule, Rfl: 0  Cardiovascular studies:  Exercise/Lexican  Tetrofosmin stress test 06/29/2021: Exercise nuclear stress test was performed using Bruce protocol, with injection of IV Lexiscan due to inadequate heart rate response. Patient reached 7 METS, and 79% of age predicted maximum heart rate. Exercise capacity was fair. No chest pain reported. Dyspnea reported. Suboptimal heart rate response. Lower blood pressure noted post injection. Stress EKG revealed no ischemic changes. SPECT images show medium sized, severe intensity, reversible perfusion defect in mid to apical, inferolateral myocardium. Stress LVEF 68%. Intermediate risk study.   Echocardiogram 05/26/2021:  Normal LV systolic function with visual EF 60-65%. Left ventricle cavity  is normal in size. Normal left ventricular wall thickness. Normal global  wall motion. Normal diastolic filling pattern, normal LAP.  Mild pulmonic regurgitation.  Trivial pericardial effusion. There is no hemodynamic significance.  Compared to study 11/06/2018: Mild PR and trivial pericardial effusion are  new finding; otherwise, no significant change.   EKG 05/20/2021: Sinus rhythm 54 bpm LAFB  Abdominal Aortic Duplex  09/26/2019:  A distal abdominal aortic aneurysm measuring 2.82 x 2.82 x 2.92 cm is  seen.  Normal iliac artery velocity.  Consider rescreen in 5-10 years. The size correlated to the CT scan on  08/15/2016 measuring 3.0x3.5cm allowing for the differences in imaging.   ABI 09/2019: This exam reveals normal perfusion of the lower extremity (ABI 1.00).  Mildly abnormal PVR waveforms bilaterally.   Event  monitor 01/04/2019 - 02/02/2019: SInus rhythm. HR 43-121 bpm. No arrhythmia noted.   Echocardiogram 11/06/2018: Normal LV systolic function with EF 68%. Left ventricle cavity is normal in size. Normal global wall motion. Doppler evidence of grade I (impaired) diastolic dysfunction, normal LAP. Calculated EF 68%. Mild (Grade I) mitral regurgitation. Normal right atrial pressure.  CT Chest  08/16/2017: 1. Lung-RADS 1, negative. Continue annual screening with low-dose chest CT without contrast in 12 months. 2. Aortic atherosclerosis (ICD10-170.0). Coronary artery calcification. 3.  Emphysema (ICD10-J43.9).  US abdomen 08/15/2017: 1. No acute findings. 2. Infrarenal abdominal aortic aneurysm measures 3.0 cm anterior-posterior by 3.5 cm right to left, without significant change from the prior CT allowing for the differences in imaging modalities, and measurement technique. Recommend followup by ultrasound in 3 years. This recommendation follows ACR consensus guidelines: White Paper of the ACR Incidental Findings Committee II on Vascular Findings. J Am Coll Radiol 2013; 88:828-003 3. Small nonobstructing stones in each kidney.  No hydronephrosis.  Recent labs: 10/07/2021: Glucose 95, BUN/Cr 13/0.82. EGFR 91. Na/K 140/4.1. Rest of the CMP normal H/H 14/43. MCV 91. Platelets could not be calculated due to clumping HbA1C 5.8% Chol 141, TG 205, HDL 50, LDL 58 TSH 0.8 normal  09/08/2020: Glucose 123, BUN/Cr 12/0.7. EGFR 93. HbA1C 5.9% Chol 136, TG 187, HDL 47, LDL 59 TSH 1.1 normal  05/15/2020: Chol 159, TG 204, HDL 50, LDL 75  02/16/2019: Glucose 108, BUN/Cr 11/0.9. eGFR 78.  HbA1C 5.7% Chol 158, TG 205, HDL 50, LDL 74  08/02/2017: Glucose 97, BUN/Cr 9/0.87. eGFR 86. Na/K 142/4.5. Rest of the CMP normal.  H/H 14/44. MCV 92. Platelets 193.  Chol 201, TG 196, HDL 47, LDL 115.   Review of Systems  Cardiovascular:  Negative for chest pain, dyspnea on exertion, leg swelling, palpitations and syncope.  Respiratory:  Negative for shortness of breath.   Musculoskeletal:  Positive for back pain.  Neurological:  Positive for numbness (In feet. Longstanding).  All other systems reviewed and are negative.    Vitals:   10/16/21 1444  BP: 131/62  Pulse: (!) 54  Resp: 17  Temp: 98.6 F (37 C)  SpO2: 98%    Objective:      Physical Exam Vitals and nursing note  reviewed.  Constitutional:      Appearance: He is well-developed.  Neck:     Vascular: No JVD.  Cardiovascular:     Rate and Rhythm: Normal rate and regular rhythm.     Pulses:          Femoral pulses are 2+ on the right side and 3+ on the left side.      Popliteal pulses are 1+ on the right side and 1+ on the left side.       Dorsalis pedis pulses are 1+ on the right side and 1+ on the left side.       Posterior tibial pulses are 0 on the right side and 0 on the left side.     Heart sounds: Normal heart sounds. No murmur heard. Pulmonary:     Effort: Pulmonary effort is normal.     Breath sounds: Normal breath sounds. No wheezing or rales.  Musculoskeletal:     Right lower leg: No edema.     Left lower leg: No edema.       Assessment & Recommendations:   76 y.o. Caucasian male with hypertension, stable AAA, aortic atherosclerosis, mixed hyperlipidemia, h/o tobacco abuse  Exertional dyspnea: Known coronary calcification. Possible  angina equivalent. Intermediate risk stress test with medium sized, severe intensity, reversible perfusion defect in mid to apical, inferolateral myocardium.  Given his relatively controlled symptoms and no lifestyle limiting dyspnea, okay to continue medical management for now. If symptoms worsen, would recommend coronary angiography and possible intervention.  Currently on Aspirin, Crestor, felodipine, atenolol.  Should he need to undergo any surgical procedure for his back pain, could consider coronary angiography for risk stratification  Mixed hyperlipidemia: Lipids very well controlled on statin  Hypertension: Controlled  AAA: Small aneurysm, as above.  Consider repeat ultrasound scheduled in 5-10 years  F/u in 6 months  [place], MD [facility] [place] Pager: [phone] Office: [phone] If no answer Cell [phone]

## 2021-10-22 DIAGNOSIS — H524 Presbyopia: Secondary | ICD-10-CM | POA: Diagnosis not present

## 2021-10-22 DIAGNOSIS — H5203 Hypermetropia, bilateral: Secondary | ICD-10-CM | POA: Diagnosis not present

## 2021-10-22 DIAGNOSIS — H2513 Age-related nuclear cataract, bilateral: Secondary | ICD-10-CM | POA: Diagnosis not present

## 2021-10-28 DIAGNOSIS — M48061 Spinal stenosis, lumbar region without neurogenic claudication: Secondary | ICD-10-CM | POA: Diagnosis not present

## 2021-10-29 ENCOUNTER — Ambulatory Visit (INDEPENDENT_AMBULATORY_CARE_PROVIDER_SITE_OTHER)
Admission: RE | Admit: 2021-10-29 | Discharge: 2021-10-29 | Disposition: A | Discharge status: Home / Self Care | Payer: Medicare HMO | Source: Ambulatory Visit | Attending: Acute Care | Admitting: Acute Care

## 2021-10-29 DIAGNOSIS — I10 Essential (primary) hypertension: Secondary | ICD-10-CM | POA: Diagnosis not present

## 2021-10-29 DIAGNOSIS — R3129 Other microscopic hematuria: Secondary | ICD-10-CM | POA: Diagnosis not present

## 2021-10-29 DIAGNOSIS — Z87891 Personal history of nicotine dependence: Secondary | ICD-10-CM

## 2021-10-29 DIAGNOSIS — M179 Osteoarthritis of knee, unspecified: Secondary | ICD-10-CM | POA: Diagnosis not present

## 2021-10-29 DIAGNOSIS — G629 Polyneuropathy, unspecified: Secondary | ICD-10-CM | POA: Diagnosis not present

## 2021-10-29 DIAGNOSIS — M25552 Pain in left hip: Secondary | ICD-10-CM | POA: Diagnosis not present

## 2021-10-29 DIAGNOSIS — Z1331 Encounter for screening for depression: Secondary | ICD-10-CM | POA: Diagnosis not present

## 2021-10-29 DIAGNOSIS — E559 Vitamin D deficiency, unspecified: Secondary | ICD-10-CM | POA: Diagnosis not present

## 2021-10-29 DIAGNOSIS — I714 Abdominal aortic aneurysm, without rupture, unspecified: Secondary | ICD-10-CM | POA: Diagnosis not present

## 2021-10-29 DIAGNOSIS — I700 Atherosclerosis of aorta: Secondary | ICD-10-CM | POA: Diagnosis not present

## 2021-10-29 DIAGNOSIS — Z0000 Encounter for general adult medical examination without abnormal findings: Secondary | ICD-10-CM | POA: Diagnosis not present

## 2021-10-29 DIAGNOSIS — G4700 Insomnia, unspecified: Secondary | ICD-10-CM | POA: Diagnosis not present

## 2021-10-29 DIAGNOSIS — E782 Mixed hyperlipidemia: Secondary | ICD-10-CM | POA: Diagnosis not present

## 2021-11-02 ENCOUNTER — Other Ambulatory Visit: Payer: Self-pay | Admitting: Acute Care

## 2021-11-02 DIAGNOSIS — M48061 Spinal stenosis, lumbar region without neurogenic claudication: Secondary | ICD-10-CM | POA: Diagnosis not present

## 2021-11-02 DIAGNOSIS — Z122 Encounter for screening for malignant neoplasm of respiratory organs: Secondary | ICD-10-CM

## 2021-11-02 DIAGNOSIS — Z87891 Personal history of nicotine dependence: Secondary | ICD-10-CM

## 2021-11-12 DIAGNOSIS — M48061 Spinal stenosis, lumbar region without neurogenic claudication: Secondary | ICD-10-CM | POA: Diagnosis not present

## 2021-11-18 DIAGNOSIS — M48061 Spinal stenosis, lumbar region without neurogenic claudication: Secondary | ICD-10-CM | POA: Diagnosis not present

## 2021-12-02 DIAGNOSIS — R3121 Asymptomatic microscopic hematuria: Secondary | ICD-10-CM | POA: Diagnosis not present

## 2021-12-02 DIAGNOSIS — Z87442 Personal history of urinary calculi: Secondary | ICD-10-CM | POA: Diagnosis not present

## 2021-12-09 DIAGNOSIS — R3121 Asymptomatic microscopic hematuria: Secondary | ICD-10-CM | POA: Diagnosis not present

## 2021-12-22 DIAGNOSIS — R319 Hematuria, unspecified: Secondary | ICD-10-CM | POA: Diagnosis not present

## 2021-12-22 DIAGNOSIS — R109 Unspecified abdominal pain: Secondary | ICD-10-CM | POA: Diagnosis not present

## 2021-12-22 DIAGNOSIS — R3121 Asymptomatic microscopic hematuria: Secondary | ICD-10-CM | POA: Diagnosis not present

## 2022-01-15 DIAGNOSIS — R3121 Asymptomatic microscopic hematuria: Secondary | ICD-10-CM | POA: Diagnosis not present

## 2022-01-15 DIAGNOSIS — N200 Calculus of kidney: Secondary | ICD-10-CM | POA: Diagnosis not present

## 2022-03-15 DIAGNOSIS — R1031 Right lower quadrant pain: Secondary | ICD-10-CM | POA: Diagnosis not present

## 2022-03-20 ENCOUNTER — Emergency Department (HOSPITAL_BASED_OUTPATIENT_CLINIC_OR_DEPARTMENT_OTHER): Payer: Medicare HMO

## 2022-03-20 ENCOUNTER — Other Ambulatory Visit: Payer: Self-pay

## 2022-03-20 ENCOUNTER — Emergency Department (HOSPITAL_BASED_OUTPATIENT_CLINIC_OR_DEPARTMENT_OTHER)
Admission: EM | Admit: 2022-03-20 | Discharge: 2022-03-20 | Disposition: A | Discharge status: Home / Self Care | Payer: Medicare HMO | Attending: Emergency Medicine | Admitting: Emergency Medicine

## 2022-03-20 DIAGNOSIS — M545 Low back pain, unspecified: Secondary | ICD-10-CM | POA: Insufficient documentation

## 2022-03-20 DIAGNOSIS — I7143 Infrarenal abdominal aortic aneurysm, without rupture: Secondary | ICD-10-CM | POA: Diagnosis not present

## 2022-03-20 DIAGNOSIS — K5900 Constipation, unspecified: Secondary | ICD-10-CM | POA: Diagnosis not present

## 2022-03-20 DIAGNOSIS — R1031 Right lower quadrant pain: Secondary | ICD-10-CM

## 2022-03-20 DIAGNOSIS — Z7982 Long term (current) use of aspirin: Secondary | ICD-10-CM | POA: Diagnosis not present

## 2022-03-20 LAB — COMPREHENSIVE METABOLIC PANEL
ALT: 10 U/L (ref 0–44)
AST: 17 U/L (ref 15–41)
Albumin: 4.3 g/dL (ref 3.5–5.0)
Alkaline Phosphatase: 40 U/L (ref 38–126)
Anion gap: 12 (ref 5–15)
BUN: 14 mg/dL (ref 8–23)
CO2: 22 mmol/L (ref 22–32)
Calcium: 9.1 mg/dL (ref 8.9–10.3)
Chloride: 105 mmol/L (ref 98–111)
Creatinine, Ser: 0.89 mg/dL (ref 0.61–1.24)
GFR, Estimated: >60 mL/min (ref >60)
Glucose, Bld: 111 mg/dL — ABNORMAL HIGH (ref 70–99)
Potassium: 4.3 mmol/L (ref 3.5–5.1)
Sodium: 139 mmol/L (ref 135–145)
Total Bilirubin: 0.8 mg/dL (ref 0.3–1.2)
Total Protein: 7.1 g/dL (ref 6.5–8.1)

## 2022-03-20 LAB — CBC
HCT: 44.9 % (ref 39.0–52.0)
Hemoglobin: 15 g/dL (ref 13.0–17.0)
MCH: 31.3 pg (ref 26.0–34.0)
MCHC: 33.4 g/dL (ref 30.0–36.0)
MCV: 93.7 fL (ref 80.0–100.0)
Platelets: 167 10*3/uL (ref 150–400)
RBC: 4.79 MIL/uL (ref 4.22–5.81)
RDW: 12 % (ref 11.5–15.5)
WBC: 7 10*3/uL (ref 4.0–10.5)
nRBC: 0 % (ref 0.0–0.2)

## 2022-03-20 LAB — URINALYSIS, ROUTINE W REFLEX MICROSCOPIC
Bilirubin Urine: NEGATIVE
Glucose, UA: NEGATIVE mg/dL
Hgb urine dipstick: NEGATIVE
Ketones, ur: NEGATIVE mg/dL
Leukocytes,Ua: NEGATIVE
Nitrite: NEGATIVE
Specific Gravity, Urine: 1.034 — ABNORMAL HIGH (ref 1.005–1.030)
pH: 5.5 (ref 5.0–8.0)

## 2022-03-20 LAB — LIPASE, BLOOD: Lipase: 31 U/L (ref 11–51)

## 2022-03-20 MED ORDER — IOHEXOL 350 MG/ML SOLN
100.0000 mL | Freq: Once | INTRAVENOUS | Status: AC | PRN
  Administered 2022-03-20: 100 mL via INTRAVENOUS

## 2022-03-20 MED ORDER — OXYCODONE-ACETAMINOPHEN 5-325 MG PO TABS
1.0000 | ORAL_TABLET | Freq: Once | ORAL | Status: AC
  Administered 2022-03-20: 1 via ORAL
  Filled 2022-03-20: qty 1

## 2022-03-20 MED ORDER — LIDOCAINE 5 % EX PTCH: 1.0000 | MEDICATED_PATCH | CUTANEOUS | 0 refills | Status: DC

## 2022-03-20 MED ORDER — SODIUM CHLORIDE 0.9 % IV BOLUS
500.0000 mL | Freq: Once | INTRAVENOUS | Status: AC
  Administered 2022-03-20: 500 mL via INTRAVENOUS

## 2022-03-20 MED ORDER — OXYCODONE-ACETAMINOPHEN 5-325 MG PO TABS: 1.0000 | ORAL_TABLET | Freq: Four times a day (QID) | ORAL | 0 refills | Status: DC | PRN

## 2022-03-20 MED ORDER — LIDOCAINE 5 % EX PTCH
1.0000 | MEDICATED_PATCH | CUTANEOUS | Status: DC
  Administered 2022-03-20: 1 via TRANSDERMAL
  Filled 2022-03-20: qty 1

## 2022-03-20 MED ORDER — MORPHINE SULFATE (PF) 4 MG/ML IV SOLN
4.0000 mg | Freq: Once | INTRAVENOUS | Status: AC
  Administered 2022-03-20: 4 mg via INTRAVENOUS
  Filled 2022-03-20: qty 1

## 2022-03-20 NOTE — ED Notes (Signed)
Patient and wife verbalized understanding of discharge instructions and reasons to return to the ED

## 2022-03-20 NOTE — ED Provider Notes (Signed)
[facility] EMERGENCY DEPT Provider Note   CSN: [id number] Arrival date & time: 03/20/22  1719     History {Add pertinent medical, surgical, social history, OB history to HPI:1} Chief Complaint  Patient presents with   Back Pain   Abdominal Pain    William Warner is a 76 y.o. male.  He has a known history of an infrarenal aortic aneurysm, chronic back pain, frequent kidney stones.  He has had worsening of his back pain over the past few months.  Now having right lower quadrant pain that started few weeks ago.  He currently rates it 6 out of 10 but goes to 10 out of 10 with movement.  No fevers chills nausea vomiting.  He has some chronic constipation issues.  No urinary symptoms no hematuria.  Has tried nothing for his symptoms.  No numbness or weakness.  The history is provided by the patient.  Abdominal Pain Pain location:  RLQ Pain quality: aching   Pain severity:  Moderate Onset quality:  Gradual Duration:  2 weeks Timing:  Intermittent Progression:  Worsening Chronicity:  New Context: not trauma   Relieved by:  None tried Worsened by:  Movement Ineffective treatments:  Not moving Associated symptoms: constipation   Associated symptoms: no chest pain, no chills, no cough, no diarrhea, no dysuria, no fever, no nausea and no vomiting        Home Medications Prior to Admission medications   Medication Sig Start Date End Date Taking? Authorizing Provider  aspirin 81 MG tablet Take 81 mg by mouth daily.    [provider]  atenolol (TENORMIN) 25 MG tablet Take 25 mg by mouth daily.    [provider]  Cholecalciferol 50 MCG (2000 UT) TBDP Take 2,000 Units by mouth daily.    [provider]  clonazePAM (KLONOPIN) 2 MG tablet Take 2 mg by mouth at bedtime.     [provider]  COVID-19 mRNA bivalent vaccine, Pfizer, (PFIZER COVID-19 VAC BIVALENT) injection Inject into the muscle. 05/29/21   Carlyle Basques, MD  COVID-19  mRNA Vac-TriS, Pfizer, SUSP injection Inject into the muscle. 11/07/20   Carlyle Basques, MD  EPINEPHrine (EPIPEN JR) 0.15 MG/0.3ML injection Inject 0.15 mg into the muscle as needed.    [provider]  esomeprazole (NEXIUM) 40 MG capsule Take 40 mg by mouth daily before breakfast.    [provider]  felodipine (PLENDIL) 5 MG 24 hr tablet Take 5 mg by mouth daily.    [provider]  gemfibrozil (LOPID) 600 MG tablet Take 600 mg by mouth daily.     [provider]  Multiple Vitamin tablet Take 1 tablet by mouth daily.    [provider]  nitroGLYCERIN (NITROSTAT) 0.4 MG SL tablet DISSOLVE 1 TABLET UNDER THE TONGUE EVERY 5 MINUTES AS NEEDED FOR CHEST PAIN. DO NOT EXCEED A TOTAL OF 3 DOSES IN 15 MINUTES. 06/24/21   Patwardhan, Manish J, MD  rosuvastatin (CRESTOR) 20 MG tablet TAKE ONE TABLET BY MOUTH ONCE DAILY 05/21/21   Patwardhan, Reynold Bowen, MD  tamsulosin (FLOMAX) 0.4 MG CAPS capsule Take 1 capsule (0.4 mg total) by mouth daily. 06/25/14   Pixie Casino, MD      Allergies    Bee venom and Augmentin [amoxicillin-pot clavulanate]    Review of Systems   Review of Systems  Constitutional:  Negative for chills and fever.  Respiratory:  Negative for cough.   Cardiovascular:  Negative for chest pain.  Gastrointestinal:  Positive for abdominal pain and constipation. Negative for diarrhea, nausea and vomiting.  Genitourinary:  Negative for dysuria.  Musculoskeletal:  Positive for back pain.  Skin:  Negative for rash.  Neurological:  Negative for headaches.    Physical Exam Updated Vital Signs BP 129/61 (BP Location: Right Arm)   Pulse (!) 49   Temp 98.2 F (36.8 C)   Resp 16   Ht '5\' 10"'$  (1.778 m)   Wt 88 kg   SpO2 99%   BMI 27.84 kg/m  Physical Exam Vitals and nursing note reviewed.  Constitutional:      General: He is not in acute distress.    Appearance: He is well-developed.  HENT:     Head: Normocephalic and atraumatic.  Eyes:      Conjunctiva/sclera: Conjunctivae normal.  Cardiovascular:     Rate and Rhythm: Normal rate and regular rhythm.     Heart sounds: No murmur heard. Pulmonary:     Effort: Pulmonary effort is normal. No respiratory distress.     Breath sounds: Normal breath sounds.  Abdominal:     Palpations: Abdomen is soft.     Tenderness: There is abdominal tenderness in the right lower quadrant. There is no guarding or rebound.     Hernia: No hernia is present.  Genitourinary:    Penis: Normal.      Testes: Normal.  Musculoskeletal:        General: No swelling.     Cervical back: Neck supple.  Skin:    General: Skin is warm and dry.     Capillary Refill: Capillary refill takes less than 2 seconds.  Neurological:     General: No focal deficit present.     Mental Status: He is alert.     ED Results / Procedures / Treatments   Labs (all labs ordered are listed, but only abnormal results are displayed) Labs Reviewed  CBC  LIPASE, BLOOD  COMPREHENSIVE METABOLIC PANEL  URINALYSIS, ROUTINE W REFLEX MICROSCOPIC    EKG None  Radiology No results found.  Procedures Procedures  {Document cardiac monitor, telemetry assessment procedure when appropriate:1}  Medications Ordered in ED Medications - No data to display  ED Course/ Medical Decision Making/ A&P                           Medical Decision Making Amount and/or Complexity of Data Reviewed Labs: ordered.   This patient complains of ***; this involves an extensive number of treatment Options and is a complaint that carries with it a high risk of complications and morbidity. The differential includes ***  I ordered, reviewed and interpreted labs, which included *** I ordered medication *** and reviewed PMP when indicated. I ordered imaging studies which included *** and I independently    visualized and interpreted imaging which showed *** Additional history obtained from *** Previous records obtained and reviewed *** I  consulted *** and discussed lab and imaging findings and discussed disposition.  Cardiac monitoring reviewed, *** Social determinants considered, *** Critical Interventions: ***  After the interventions stated above, I reevaluated the patient and found *** Admission and further testing considered, ***   {Document critical care time when appropriate:1} {Document review of labs and clinical decision tools ie heart score, Chads2Vasc2 etc:1}  {Document your independent review of radiology images, and any outside records:1} {Document your discussion with family members, caretakers, and with consultants:1} {Document social determinants of health affecting pt's care:1} {Document your decision making  why or why not admission, treatments were needed:1} Final Clinical Impression(s) / ED Diagnoses Final diagnoses:  None    Rx / DC Orders ED Discharge Orders     None

## 2022-03-20 NOTE — ED Triage Notes (Signed)
Patient arrives with complaints of worsening lower back pain and also RLQ pain x4 days Patient reports that the pain has worsened that he has had difficulty walking.  Hx kidney stones, diverticulosis, and stable aortic aneurysm .   8/10 pain

## 2022-03-20 NOTE — Discharge Instructions (Signed)
You were seen in the emergency department for worsening back pain and new right lower quadrant abdominal pain.  You had lab work urinalysis and a CAT scan of your abdomen and pelvis that did not show an obvious explanation for your symptoms.  Your abdominal aneurysm was stable and we did not see any evidence of appendicitis or kidney stones.  We are prescribing you some narcotic pain medication, please use caution as may make you dizzy and constipated.  We are also prescribing Lidoderm patches.  Please contact your primary care doctor and the [facility] for follow-up.  Return if any worsening or concerning symptoms

## 2022-03-22 DIAGNOSIS — K219 Gastro-esophageal reflux disease without esophagitis: Secondary | ICD-10-CM | POA: Diagnosis not present

## 2022-03-22 DIAGNOSIS — I10 Essential (primary) hypertension: Secondary | ICD-10-CM | POA: Diagnosis not present

## 2022-03-22 DIAGNOSIS — E782 Mixed hyperlipidemia: Secondary | ICD-10-CM | POA: Diagnosis not present

## 2022-04-21 ENCOUNTER — Ambulatory Visit: Payer: Medicare HMO | Admitting: Cardiology

## 2022-04-26 ENCOUNTER — Encounter: Payer: Self-pay | Admitting: Cardiology

## 2022-04-26 ENCOUNTER — Ambulatory Visit: Payer: Medicare HMO | Admitting: Cardiology

## 2022-04-26 VITALS — BP 117/64 | HR 56 | Ht 70.0 in | Wt 194.6 lb

## 2022-04-26 DIAGNOSIS — I10 Essential (primary) hypertension: Secondary | ICD-10-CM | POA: Diagnosis not present

## 2022-04-26 DIAGNOSIS — E782 Mixed hyperlipidemia: Secondary | ICD-10-CM | POA: Diagnosis not present

## 2022-04-26 DIAGNOSIS — I25118 Atherosclerotic heart disease of native coronary artery with other forms of angina pectoris: Secondary | ICD-10-CM | POA: Diagnosis not present

## 2022-04-26 NOTE — Progress Notes (Signed)
Subjective:   William Warner, male    DOB: Oct 29, 1945, 76 y.o.   MRN: [id number]   Chief complaint:  Chief Complaint  Patient presents with   Follow-up   Hypertension     76 y.o. Caucasian male with hypertension, stable AAA, aortic atherosclerosis, mixed hyperlipidemia, h/o tobacco abuse  Patient denies any dizziness symptoms today.  His biggest complaints are related to his back pain and radiating burning sensation.  He has an appointment to see neurosurgery in the near future.  Blood pressure is elevated today.  Previous visits, blood pressure has been normal.  He does not check regularly at home.Subsequent blood pressure check normal.    Current Outpatient Medications:    aspirin 81 MG tablet, Take 81 mg by mouth daily., Disp: , Rfl:    atenolol (TENORMIN) 25 MG tablet, Take 25 mg by mouth daily., Disp: , Rfl:    Cholecalciferol 50 MCG (2000 UT) TBDP, Take 2,000 Units by mouth daily., Disp: , Rfl:    clonazePAM (KLONOPIN) 2 MG tablet, Take 2 mg by mouth at bedtime. , Disp: , Rfl:    COVID-19 mRNA bivalent vaccine, Pfizer, (PFIZER COVID-19 VAC BIVALENT) injection, Inject into the muscle., Disp: 0.3 mL, Rfl: 0   COVID-19 mRNA Vac-TriS, Pfizer, SUSP injection, Inject into the muscle., Disp: 0.3 mL, Rfl: 0   EPINEPHrine (EPIPEN JR) 0.15 MG/0.3ML injection, Inject 0.15 mg into the muscle as needed., Disp: , Rfl:    esomeprazole (NEXIUM) 40 MG capsule, Take 40 mg by mouth daily before breakfast., Disp: , Rfl:    felodipine (PLENDIL) 5 MG 24 hr tablet, Take 5 mg by mouth daily., Disp: , Rfl:    gemfibrozil (LOPID) 600 MG tablet, Take 600 mg by mouth daily. , Disp: , Rfl:    lidocaine (LIDODERM) 5 %, Place 1 patch onto the skin daily. Remove & Discard patch within 12 hours or as directed by MD, Disp: 30 patch, Rfl: 0   Multiple Vitamin tablet, Take 1 tablet by mouth daily., Disp: , Rfl:    nitroGLYCERIN (NITROSTAT) 0.4 MG SL tablet, DISSOLVE 1 TABLET UNDER THE TONGUE EVERY 5 MINUTES AS  NEEDED FOR CHEST PAIN. DO NOT EXCEED A TOTAL OF 3 DOSES IN 15 MINUTES., Disp: 25 tablet, Rfl: 3   oxyCODONE-acetaminophen (PERCOCET/ROXICET) 5-325 MG tablet, Take 1 tablet by mouth every 6 (six) hours as needed for severe pain., Disp: 15 tablet, Rfl: 0   rosuvastatin (CRESTOR) 20 MG tablet, TAKE ONE TABLET BY MOUTH ONCE DAILY, Disp: 90 tablet, Rfl: 2   tamsulosin (FLOMAX) 0.4 MG CAPS capsule, Take 1 capsule (0.4 mg total) by mouth daily., Disp: 14 capsule, Rfl: 0  Cardiovascular studies:  EKG 04/26/2022: Sinus rhythm 58 bpm Low voltage in limb leads  Exercise/Lexican Tetrofosmin stress test 06/29/2021: Exercise nuclear stress test was performed using Bruce protocol, with injection of IV Lexiscan due to inadequate heart rate response. Patient reached 7 METS, and 79% of age predicted maximum heart rate. Exercise capacity was fair. No chest pain reported. Dyspnea reported. Suboptimal heart rate response. Lower blood pressure noted post injection. Stress EKG revealed no ischemic changes. SPECT images show medium sized, severe intensity, reversible perfusion defect in mid to apical, inferolateral myocardium. Stress LVEF 68%. Intermediate risk study.   Echocardiogram 05/26/2021:  Normal LV systolic function with visual EF 60-65%. Left ventricle cavity  is normal in size. Normal left ventricular wall thickness. Normal global  wall motion. Normal diastolic filling pattern, normal LAP.  Mild pulmonic regurgitation.  Trivial  pericardial effusion. There is no hemodynamic significance.  Compared to study 11/06/2018: Mild PR and trivial pericardial effusion are  new finding; otherwise, no significant change.   Abdominal Aortic Duplex  09/26/2019:  A distal abdominal aortic aneurysm measuring 2.82 x 2.82 x 2.92 cm is  seen.  Normal iliac artery velocity.  Consider rescreen in 5-10 years. The size correlated to the CT scan on  08/15/2016 measuring 3.0x3.5cm allowing for the differences in imaging.   ABI  09/2019: This exam reveals normal perfusion of the lower extremity (ABI 1.00).  Mildly abnormal PVR waveforms bilaterally.   Event monitor 01/04/2019 - 02/02/2019: SInus rhythm. HR 43-121 bpm. No arrhythmia noted.   Echocardiogram 11/06/2018: Normal LV systolic function with EF 68%. Left ventricle cavity is normal in size. Normal global wall motion. Doppler evidence of grade I (impaired) diastolic dysfunction, normal LAP. Calculated EF 68%. Mild (Grade I) mitral regurgitation. Normal right atrial pressure.  CT Chest 08/16/2017: 1. Lung-RADS 1, negative. Continue annual screening with low-dose chest CT without contrast in 12 months. 2. Aortic atherosclerosis (ICD10-170.0). Coronary artery calcification. 3.  Emphysema (ICD10-J43.9).  US abdomen 08/15/2017: 1. No acute findings. 2. Infrarenal abdominal aortic aneurysm measures 3.0 cm anterior-posterior by 3.5 cm right to left, without significant change from the prior CT allowing for the differences in imaging modalities, and measurement technique. Recommend followup by ultrasound in 3 years. This recommendation follows ACR consensus guidelines: White Paper of the ACR Incidental Findings Committee II on Vascular Findings. J Am Coll Radiol 2013; 80:165-537 3. Small nonobstructing stones in each kidney.  No hydronephrosis.  Recent labs: 03/20/2022: Glucose 111, BUN/Cr 14/0.89. EGFR >60. Na/K 139/4.3. Rest of the CMP normal H/H 15/44. MCV 93. Platelets 167  10/07/2021: Glucose 95, BUN/Cr 13/0.82. EGFR 91. Na/K 140/4.1. Rest of the CMP normal H/H 14/43. MCV 91. Platelets could not be calculated due to clumping HbA1C 5.8% Chol 141, TG 205, HDL 50, LDL 58 TSH 0.8 normal  Review of Systems  Cardiovascular:  Negative for chest pain, dyspnea on exertion, leg swelling, palpitations and syncope.  Respiratory:  Negative for shortness of breath.   Musculoskeletal:  Positive for back pain.  Neurological:  Positive for numbness (In feet.  Longstanding).  All other systems reviewed and are negative.     Vitals:   04/26/22 1506  BP: (!) 146/65  Pulse: 62  SpO2: 96%    Objective:      Physical Exam Vitals and nursing note reviewed.  Constitutional:      Appearance: He is well-developed.  Neck:     Vascular: No JVD.  Cardiovascular:     Rate and Rhythm: Normal rate and regular rhythm.     Pulses:          Femoral pulses are 2+ on the right side and 3+ on the left side.      Popliteal pulses are 1+ on the right side and 1+ on the left side.       Dorsalis pedis pulses are 1+ on the right side and 1+ on the left side.       Posterior tibial pulses are 0 on the right side and 0 on the left side.     Heart sounds: Normal heart sounds. No murmur heard. Pulmonary:     Effort: Pulmonary effort is normal.     Breath sounds: Normal breath sounds. No wheezing or rales.  Musculoskeletal:     Right lower leg: No edema.     Left lower leg: No edema.  Assessment & Recommendations:   76 y.o. Caucasian male with hypertension, stable AAA, aortic atherosclerosis, mixed hyperlipidemia, h/o tobacco abuse  Exertional dyspnea: Known coronary calcification. Possible angina equivalent. Intermediate risk stress test with medium sized, severe intensity, reversible perfusion defect in mid to apical, inferolateral myocardium.  Given his relatively controlled symptoms and no lifestyle limiting dyspnea, okay to continue medical management for now. If symptoms worsen, would recommend coronary angiography and possible intervention.  Currently on Aspirin, Crestor, felodipine, atenolol.  Should he need to undergo any surgical procedure for his back pain, could consider proceeding with moderate perioperative cardiac risk, given lack of symptoms.  Mixed hyperlipidemia: Lipids very well controlled on statin  Hypertension: Controlled  AAA: Small aneurysm, as above.  Consider repeat ultrasound scheduled in 5-10 years  F/u in 1  year   Nigel Mormon, MD Pager: [phone] Office: [phone]

## 2022-05-04 DIAGNOSIS — M5416 Radiculopathy, lumbar region: Secondary | ICD-10-CM | POA: Diagnosis not present

## 2022-05-13 DIAGNOSIS — M545 Low back pain, unspecified: Secondary | ICD-10-CM | POA: Diagnosis not present

## 2022-05-13 DIAGNOSIS — M5416 Radiculopathy, lumbar region: Secondary | ICD-10-CM | POA: Diagnosis not present

## 2022-05-13 DIAGNOSIS — M4316 Spondylolisthesis, lumbar region: Secondary | ICD-10-CM | POA: Diagnosis not present

## 2022-05-13 DIAGNOSIS — M47816 Spondylosis without myelopathy or radiculopathy, lumbar region: Secondary | ICD-10-CM | POA: Diagnosis not present

## 2022-05-13 DIAGNOSIS — M4807 Spinal stenosis, lumbosacral region: Secondary | ICD-10-CM | POA: Diagnosis not present

## 2022-07-19 ENCOUNTER — Other Ambulatory Visit (HOSPITAL_COMMUNITY): Payer: Self-pay

## 2022-07-20 ENCOUNTER — Other Ambulatory Visit (HOSPITAL_COMMUNITY): Payer: Self-pay

## 2022-07-20 MED ORDER — CLONAZEPAM 2 MG PO TABS
2.0000 mg | ORAL_TABLET | Freq: Every day | ORAL | 0 refills | Status: DC
  Filled 2022-07-20: qty 90, 90d supply, fill #0

## 2022-08-25 DIAGNOSIS — R1031 Right lower quadrant pain: Secondary | ICD-10-CM | POA: Diagnosis present

## 2022-08-25 DIAGNOSIS — Z7982 Long term (current) use of aspirin: Secondary | ICD-10-CM | POA: Insufficient documentation

## 2022-08-25 DIAGNOSIS — N200 Calculus of kidney: Secondary | ICD-10-CM | POA: Insufficient documentation

## 2022-08-26 ENCOUNTER — Emergency Department (HOSPITAL_COMMUNITY): Payer: PPO

## 2022-08-26 ENCOUNTER — Encounter (HOSPITAL_COMMUNITY): Payer: Self-pay | Admitting: Emergency Medicine

## 2022-08-26 ENCOUNTER — Emergency Department (HOSPITAL_COMMUNITY)
Admission: EM | Admit: 2022-08-26 | Discharge: 2022-08-26 | Disposition: A | Discharge status: Home / Self Care | Payer: PPO | Attending: Emergency Medicine | Admitting: Emergency Medicine

## 2022-08-26 DIAGNOSIS — N200 Calculus of kidney: Secondary | ICD-10-CM

## 2022-08-26 LAB — CBC WITH DIFFERENTIAL/PLATELET
Abs Immature Granulocytes: 0.02 10*3/uL (ref 0.00–0.07)
Basophils Absolute: 0 10*3/uL (ref 0.0–0.1)
Basophils Relative: 0 %
Eosinophils Absolute: 0.1 10*3/uL (ref 0.0–0.5)
Eosinophils Relative: 1 %
HCT: 43.4 % (ref 39.0–52.0)
Hemoglobin: 14.8 g/dL (ref 13.0–17.0)
Immature Granulocytes: 0 %
Lymphocytes Relative: 13 %
Lymphs Abs: 1.2 10*3/uL (ref 0.7–4.0)
MCH: 32.1 pg (ref 26.0–34.0)
MCHC: 34.1 g/dL (ref 30.0–36.0)
MCV: 94.1 fL (ref 80.0–100.0)
Monocytes Absolute: 0.6 10*3/uL (ref 0.1–1.0)
Monocytes Relative: 7 %
Neutro Abs: 7 10*3/uL (ref 1.7–7.7)
Neutrophils Relative %: 79 %
Platelets: 182 10*3/uL (ref 150–400)
RBC: 4.61 MIL/uL (ref 4.22–5.81)
RDW: 12.1 % (ref 11.5–15.5)
WBC: 8.9 10*3/uL (ref 4.0–10.5)
nRBC: 0 % (ref 0.0–0.2)

## 2022-08-26 LAB — COMPREHENSIVE METABOLIC PANEL
ALT: 15 U/L (ref 0–44)
AST: 24 U/L (ref 15–41)
Albumin: 4 g/dL (ref 3.5–5.0)
Alkaline Phosphatase: 46 U/L (ref 38–126)
Anion gap: 13 (ref 5–15)
BUN: 14 mg/dL (ref 8–23)
CO2: 26 mmol/L (ref 22–32)
Calcium: 9.1 mg/dL (ref 8.9–10.3)
Chloride: 99 mmol/L (ref 98–111)
Creatinine, Ser: 1.03 mg/dL (ref 0.61–1.24)
GFR, Estimated: >60 mL/min (ref >60)
Glucose, Bld: 98 mg/dL (ref 70–99)
Potassium: 4 mmol/L (ref 3.5–5.1)
Sodium: 138 mmol/L (ref 135–145)
Total Bilirubin: 0.8 mg/dL (ref 0.3–1.2)
Total Protein: 6.7 g/dL (ref 6.5–8.1)

## 2022-08-26 LAB — URINALYSIS, ROUTINE W REFLEX MICROSCOPIC
Bilirubin Urine: NEGATIVE
Glucose, UA: NEGATIVE mg/dL
Hgb urine dipstick: NEGATIVE
Ketones, ur: 5 mg/dL — AB
Leukocytes,Ua: NEGATIVE
Nitrite: NEGATIVE
Protein, ur: NEGATIVE mg/dL
Specific Gravity, Urine: 1.025 (ref 1.005–1.030)
pH: 5 (ref 5.0–8.0)

## 2022-08-26 LAB — LIPASE, BLOOD: Lipase: 48 U/L (ref 11–51)

## 2022-08-26 MED ORDER — ONDANSETRON HCL 4 MG PO TABS: 4.0000 mg | ORAL_TABLET | Freq: Three times a day (TID) | ORAL | 0 refills | Status: DC | PRN

## 2022-08-26 MED ORDER — IBUPROFEN 600 MG PO TABS: 600.0000 mg | ORAL_TABLET | Freq: Four times a day (QID) | ORAL | 0 refills | Status: AC | PRN

## 2022-08-26 MED ORDER — IOHEXOL 350 MG/ML SOLN
75.0000 mL | Freq: Once | INTRAVENOUS | Status: AC | PRN
  Administered 2022-08-26: 75 mL via INTRAVENOUS

## 2022-08-26 MED ORDER — FENTANYL CITRATE PF 50 MCG/ML IJ SOSY
25.0000 ug | PREFILLED_SYRINGE | Freq: Once | INTRAMUSCULAR | Status: AC
  Administered 2022-08-26: 25 ug via INTRAVENOUS
  Filled 2022-08-26: qty 1

## 2022-08-26 MED ORDER — OXYCODONE HCL 5 MG PO TABS: 5.0000 mg | ORAL_TABLET | ORAL | 0 refills | Status: DC | PRN

## 2022-08-26 NOTE — ED Provider Triage Note (Signed)
Emergency Medicine Provider Triage Evaluation Note  William Warner , a 77 y.o. male  was evaluated in triage.  Pt complains of RLQ pain since 9pm with nausea.  Review of Systems  Positive: Abdominal pain Negative: fever  Physical Exam  BP (!) 137/57 (BP Location: Right Arm)   Pulse 60   Temp 97.7 F (36.5 C) (Oral)   Resp 18   SpO2 96%  Gen:   Awake, no distress   Resp:  Normal effort  MSK:   Moves extremities without difficulty  Other:  Moderate RLQ pain, no R/G/R  Medical Decision Making  Medically screening exam initiated at 12:21 AM.  Appropriate orders placed.  William Warner was informed that the remainder of the evaluation will be completed by another provider, this initial triage assessment does not replace that evaluation, and the importance of remaining in the ED until their evaluation is complete.     Ma Rings, [place] 08/26/22 0023

## 2022-08-26 NOTE — ED Triage Notes (Addendum)
RLQ pain radiating to back. Nausea. No vomiting. Started at 9pm. Denies belly surgeries. Pain will ease off but then comes back. Denies chest pain and SOB

## 2022-08-26 NOTE — Discharge Instructions (Addendum)
You were diagnosed today with a right-sided kidney stone.  This will likely pass on its own based on the size.  Please take the scheduled ibuprofen, Roxicodone for breakthrough pain, and Zofran for nausea.  Please continue take your home Flomax.  Follow-up as needed with urology.

## 2022-08-26 NOTE — ED Provider Notes (Signed)
[facility]   CSN: [id number] Arrival date & time: 08/25/22  2348     History  Chief Complaint  Patient presents with   Abdominal Pain    William Warner is a 77 y.o. male.  Patient presented to the emergency department complaining of right lower quadrant abdominal pain which radiated to the back.  Patient also complained of nausea but denied vomiting.  Pain started at 9 PM last night.  Patient endorses history of 12+ kidney stones.  He states that the pain has been coming in waves.  He denies urinary symptoms, chest pain, shortness of breath, fevers.  Past medical history otherwise significant for AAA, DJD, PVD, gastritis history  HPI     Home Medications Prior to Admission medications   Medication Sig Start Date End Date Taking? Authorizing Provider  ibuprofen (ADVIL) 600 MG tablet Take 1 tablet (600 mg total) by mouth every 6 (six) hours as needed. 08/26/22  Yes Barrie DunkerMcCauley, Areeb Corron B, PA-C  ondansetron (ZOFRAN) 4 MG tablet Take 1 tablet (4 mg total) by mouth every 8 (eight) hours as needed for nausea or vomiting. 08/26/22  Yes Barrie DunkerMcCauley, Lylith Bebeau B, PA-C  oxyCODONE (ROXICODONE) 5 MG immediate release tablet Take 1 tablet (5 mg total) by mouth every 4 (four) hours as needed for severe pain. 08/26/22  Yes Darrick GrinderMcCauley, Irving Bloor B, PA-C  aspirin 81 MG tablet Take 81 mg by mouth daily.    [provider]  atenolol (TENORMIN) 25 MG tablet Take 25 mg by mouth daily.    [provider]  Cholecalciferol 50 MCG (2000 UT) TBDP Take 2,000 Units by mouth daily.    [provider]  clonazePAM (KLONOPIN) 2 MG tablet Take 2 mg by mouth at bedtime.     [provider]  clonazePAM (KLONOPIN) 2 MG tablet Take 1 tablet (2 mg total) by mouth daily. 07/19/22     COVID-19 mRNA bivalent vaccine, Pfizer, (PFIZER COVID-19 VAC BIVALENT) injection Inject into the muscle. 05/29/21   Judyann MunsonSnider, Cynthia, MD  COVID-19 mRNA Vac-TriS,  Pfizer, SUSP injection Inject into the muscle. 11/07/20   Judyann MunsonSnider, Cynthia, MD  cyanocobalamin (VITAMIN B12) 500 MCG tablet Take 500 mcg by mouth daily.    [provider]  EPINEPHrine (EPIPEN JR) 0.15 MG/0.3ML injection Inject 0.15 mg into the muscle as needed.    [provider]  esomeprazole (NEXIUM) 40 MG capsule Take 40 mg by mouth daily before breakfast.    [provider]  felodipine (PLENDIL) 5 MG 24 hr tablet Take 5 mg by mouth daily.    [provider]  gemfibrozil (LOPID) 600 MG tablet Take 600 mg by mouth daily.     [provider]  lidocaine (LIDODERM) 5 % Place 1 patch onto the skin daily. Remove & Discard patch within 12 hours or as directed by MD 03/20/22   Terrilee FilesButler, Michael C, MD  Multiple Vitamin tablet Take 1 tablet by mouth daily.    [provider]  nitroGLYCERIN (NITROSTAT) 0.4 MG SL tablet DISSOLVE 1 TABLET UNDER THE TONGUE EVERY 5 MINUTES AS NEEDED FOR CHEST PAIN. DO NOT EXCEED A TOTAL OF 3 DOSES IN 15 MINUTES. 06/24/21   Patwardhan, Manish J, MD  rosuvastatin (CRESTOR) 20 MG tablet TAKE ONE TABLET BY MOUTH ONCE DAILY 05/21/21   Patwardhan, Anabel BeneManish J, MD  tamsulosin (FLOMAX) 0.4 MG CAPS capsule Take 1 capsule (0.4 mg total) by mouth daily. 06/25/14   Mabe, Latanya MaudlinMartha L, MD  Allergies    Bee venom and Augmentin [amoxicillin-pot clavulanate]    Review of Systems   Review of Systems  Physical Exam Updated Vital Signs BP (!) 140/68 (BP Location: Right Arm)   Pulse 61   Temp 97.8 F (36.6 C) (Oral)   Resp 16   Ht 5\' 10"  (1.778 m)   Wt 88 kg   SpO2 100%   BMI 27.84 kg/m  Physical Exam Vitals and nursing note reviewed.  Constitutional:      General: He is not in acute distress.    Appearance: He is well-developed.  HENT:     Head: Normocephalic and atraumatic.  Eyes:     Conjunctiva/sclera: Conjunctivae normal.  Cardiovascular:     Rate and Rhythm: Normal rate and regular rhythm.     Heart sounds: No murmur  heard. Pulmonary:     Effort: Pulmonary effort is normal. No respiratory distress.     Breath sounds: Normal breath sounds.  Abdominal:     Palpations: Abdomen is soft.     Tenderness: There is abdominal tenderness in the right lower quadrant. There is right CVA tenderness.  Musculoskeletal:        General: No swelling.     Cervical back: Neck supple.  Skin:    General: Skin is warm and dry.     Capillary Refill: Capillary refill takes less than 2 seconds.  Neurological:     Mental Status: He is alert.  Psychiatric:        Mood and Affect: Mood normal.     ED Results / Procedures / Treatments   Labs (all labs ordered are listed, but only abnormal results are displayed) Labs Reviewed  URINALYSIS, ROUTINE W REFLEX MICROSCOPIC - Abnormal; Notable for the following components:      Result Value   Ketones, ur 5 (*)    All other components within normal limits  CBC WITH DIFFERENTIAL/PLATELET  COMPREHENSIVE METABOLIC PANEL  LIPASE, BLOOD    EKG None  Radiology CT Abdomen Pelvis W Contrast  Result Date: 08/26/2022 CLINICAL DATA:  Right lower quadrant pain radiating to back with nausea. EXAM: CT ABDOMEN AND PELVIS WITH CONTRAST TECHNIQUE: Multidetector CT imaging of the abdomen and pelvis was performed using the standard protocol following bolus administration of intravenous contrast. RADIATION DOSE REDUCTION: This exam was performed according to the departmental dose-optimization program which includes automated exposure control, adjustment of the mA and/or kV according to patient size and/or use of iterative reconstruction technique. CONTRAST:  54mL OMNIPAQUE IOHEXOL 350 MG/ML SOLN COMPARISON:  03/20/2022. FINDINGS: Lower chest: There is a small pericardial effusion. Atelectasis is present at the lung bases. Hepatobiliary: No focal liver abnormality is seen. No gallstones, gallbladder wall thickening, or biliary dilatation. Pancreas: Unremarkable. No pancreatic ductal dilatation or  surrounding inflammatory changes. Spleen: Normal in size without focal abnormality. Adrenals/Urinary Tract: The adrenal glands are within normal limits. There is a slightly delayed nephrogram on the right with perinephric fat stranding. Multiple renal calculi are noted on the right. There is mild-to-moderate obstructive uropathy on the right with a 3 x 4 mm calculus in the mid right ureter. No obstructive uropathy on the left. The bladder is unremarkable. Stomach/Bowel: There is a small hiatal hernia. Stomach is within normal limits. Appendix appears normal. No evidence of bowel wall thickening, distention, or inflammatory changes. No free air or pneumatosis. Multiple diverticula are present along the sigmoid colon with bowel wall thickening. There is questionable subtle surrounding fat stranding. Vascular/Lymphatic: Aortic atherosclerosis with aneurysmal dilatation  of the distal abdominal aorta measuring 3.3 x 3.6 cm. There is atherosclerotic calcification with severe stenosis of the proximal superficial femoral artery on the right and the common femoral and superficial femoral arteries on the left. 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 No abdominal or pelvic lymphadenopathy. Reproductive: Prostate is unremarkable. Other: No abdominopelvic ascites. A fat containing umbilical hernia is present. Musculoskeletal: Degenerative changes are present in the thoracolumbar spine. There are pars defects at L5 with mild anterolisthesis at L5-S1. IMPRESSION: 1. Mild-to-moderate obstructive uropathy on the right with a 3 x 4 mm calculus in the mid right ureter. 2. Nonobstructive right renal calculi. 3. Sigmoid diverticulosis with bowel wall thickening and subtle surrounding fat stranding suggesting diverticulitis. 4. Aortic atherosclerosis with aneurysmal dilatation of the distal abdominal aorta measuring 3.6 cm. Recommend follow-up ultrasound every 2 years. This recommendation follows ACR  consensus guidelines: White Paper of the ACR Incidental Findings Committee II on Vascular Findings. J Am Coll Radiol 2013; 10:789-794. 5. Small hiatal hernia. Electronically Signed   By: Thornell Sartorius M.D.   On: 08/26/2022 02:17    Procedures Procedures    Medications Ordered in ED Medications  fentaNYL (SUBLIMAZE) injection 25 mcg (25 mcg Intravenous Given 08/26/22 0130)  iohexol (OMNIPAQUE) 350 MG/ML injection 75 mL (75 mLs Intravenous Contrast Given 08/26/22 0145)    ED Course/ Medical Decision Making/ A&P                             Medical Decision Making  This patient presents to the ED for concern of right lower quadrant abdominal pain and right-sided flank pain, this involves an extensive number of treatment options, and is a complaint that carries with it a high risk of complications and morbidity.  The differential diagnosis includes lithiasis, hydronephrosis, pyelonephritis, appendicitis, others   Co morbidities that complicate the patient evaluation  History of nephrolithiasis, AAA   Additional history obtained:  Additional history obtained from family at bedside External records from outside source obtained and reviewed including emergency department notes from November 2023, he was evaluated at that time for right lower quadrant abdominal pain   Lab Tests:  I Ordered, and personally interpreted labs.  The pertinent results include: Grossly unremarkable CBC, CMP, lipase, UA   Imaging Studies ordered:  I ordered imaging studies including CT abdomen pelvis with contrast I independently visualized and interpreted imaging which showed  1. Mild-to-moderate obstructive uropathy on the right with a 3 x 4  mm calculus in the mid right ureter.  2. Nonobstructive right renal calculi.  3. Sigmoid diverticulosis with bowel wall thickening and subtle  surrounding fat stranding suggesting diverticulitis.  4. Aortic atherosclerosis with aneurysmal dilatation of the distal   abdominal aorta measuring 3.6 cm. Recommend follow-up ultrasound  every 2 years. This recommendation follows ACR consensus guidelines:  White Paper of the ACR Incidental Findings Committee II on Vascular  Findings. J Am Coll Radiol 2013; 10:789-794.  5. Small hiatal hernia.   I agree with the radiologist interpretation   Problem List / ED Course / Critical interventions / Medication management  I ordered medication including fentanyl for pain Reevaluation of the patient after these medicines showed that the patient improved I have reviewed the patients home medicines and have made adjustments as needed   Test / Admission - Considered:  Patient with right-sided nephrolithiasis.  Patient with history of the same.  Discussed typical course of  kidney stones with the patient he states he is well aware of the typical progression.  He feels that the stone may have already passed further down his urinary tract as the pain has changed.  Plan to discharge home with scheduled ibuprofen, Roxicodone for breakthrough pain, and Zofran for nausea.  Patient states he currently has a Flomax prescription.  Follow-up as needed with urology.  Return precautions provided        Final Clinical Impression(s) / ED Diagnoses Final diagnoses:  Nephrolithiasis    Rx / DC Orders ED Discharge Orders          Ordered    ondansetron (ZOFRAN) 4 MG tablet  Every 8 hours PRN        08/26/22 0750    ibuprofen (ADVIL) 600 MG tablet  Every 6 hours PRN        08/26/22 0750    oxyCODONE (ROXICODONE) 5 MG immediate release tablet  Every 4 hours PRN        08/26/22 0750              Darrick Grinder, PA-C 08/26/22 0751    Tegeler, Canary Brim, MD 08/26/22 [phone]

## 2022-09-14 ENCOUNTER — Other Ambulatory Visit: Payer: Self-pay

## 2022-09-14 ENCOUNTER — Other Ambulatory Visit (HOSPITAL_COMMUNITY): Payer: Self-pay

## 2022-09-14 MED ORDER — ATENOLOL 25 MG PO TABS
25.0000 mg | ORAL_TABLET | Freq: Every evening | ORAL | 0 refills | Status: DC
  Filled 2022-09-14: qty 90, 90d supply, fill #0

## 2022-09-14 MED ORDER — TAMSULOSIN HCL 0.4 MG PO CAPS
0.4000 mg | ORAL_CAPSULE | Freq: Every evening | ORAL | 0 refills | Status: DC
  Filled 2022-09-14: qty 90, 90d supply, fill #0

## 2022-09-14 MED ORDER — ROSUVASTATIN CALCIUM 20 MG PO TABS
20.0000 mg | ORAL_TABLET | Freq: Every day | ORAL | 0 refills | Status: DC
  Filled 2022-09-14: qty 90, 90d supply, fill #0

## 2022-09-14 MED ORDER — GEMFIBROZIL 600 MG PO TABS
600.0000 mg | ORAL_TABLET | Freq: Every evening | ORAL | 0 refills | Status: DC
  Filled 2022-09-14: qty 90, 90d supply, fill #0

## 2022-10-07 ENCOUNTER — Other Ambulatory Visit (HOSPITAL_COMMUNITY): Payer: Self-pay

## 2022-10-07 MED ORDER — CLONAZEPAM 2 MG PO TABS
2.0000 mg | ORAL_TABLET | Freq: Every day | ORAL | 0 refills | Status: DC
  Filled 2022-10-07 – 2022-10-18 (×3): qty 90, 90d supply, fill #0

## 2022-10-14 ENCOUNTER — Other Ambulatory Visit: Payer: Self-pay

## 2022-10-14 ENCOUNTER — Other Ambulatory Visit (HOSPITAL_COMMUNITY): Payer: Self-pay

## 2022-10-18 ENCOUNTER — Other Ambulatory Visit: Payer: Self-pay

## 2022-10-18 ENCOUNTER — Other Ambulatory Visit (HOSPITAL_COMMUNITY): Payer: Self-pay

## 2022-11-01 ENCOUNTER — Ambulatory Visit (HOSPITAL_COMMUNITY)
Admission: RE | Admit: 2022-11-01 | Discharge: 2022-11-01 | Disposition: A | Discharge status: Home / Self Care | Payer: HMO | Source: Ambulatory Visit | Attending: Diagnostic Radiology | Admitting: Diagnostic Radiology

## 2022-11-01 DIAGNOSIS — J439 Emphysema, unspecified: Secondary | ICD-10-CM | POA: Insufficient documentation

## 2022-11-01 DIAGNOSIS — Z87891 Personal history of nicotine dependence: Secondary | ICD-10-CM | POA: Insufficient documentation

## 2022-11-01 DIAGNOSIS — I700 Atherosclerosis of aorta: Secondary | ICD-10-CM | POA: Insufficient documentation

## 2022-11-01 DIAGNOSIS — Z122 Encounter for screening for malignant neoplasm of respiratory organs: Secondary | ICD-10-CM | POA: Insufficient documentation

## 2022-11-01 DIAGNOSIS — I2510 Atherosclerotic heart disease of native coronary artery without angina pectoris: Secondary | ICD-10-CM | POA: Diagnosis not present

## 2022-11-09 ENCOUNTER — Other Ambulatory Visit: Payer: Self-pay

## 2022-11-09 DIAGNOSIS — Z122 Encounter for screening for malignant neoplasm of respiratory organs: Secondary | ICD-10-CM

## 2022-11-09 DIAGNOSIS — Z87891 Personal history of nicotine dependence: Secondary | ICD-10-CM

## 2022-12-06 ENCOUNTER — Other Ambulatory Visit (HOSPITAL_COMMUNITY): Payer: Self-pay

## 2022-12-07 ENCOUNTER — Other Ambulatory Visit: Payer: Self-pay

## 2022-12-07 ENCOUNTER — Other Ambulatory Visit (HOSPITAL_COMMUNITY): Payer: Self-pay

## 2022-12-07 MED ORDER — GEMFIBROZIL 600 MG PO TABS
600.0000 mg | ORAL_TABLET | Freq: Every evening | ORAL | 0 refills | Status: DC
  Filled 2022-12-07: qty 90, 90d supply, fill #0

## 2022-12-07 MED ORDER — ROSUVASTATIN CALCIUM 20 MG PO TABS
20.0000 mg | ORAL_TABLET | Freq: Every day | ORAL | 0 refills | Status: DC
  Filled 2022-12-07: qty 90, 90d supply, fill #0

## 2022-12-07 MED ORDER — TAMSULOSIN HCL 0.4 MG PO CAPS
0.4000 mg | ORAL_CAPSULE | Freq: Every evening | ORAL | 0 refills | Status: DC
  Filled 2022-12-07: qty 90, 90d supply, fill #0

## 2022-12-07 MED ORDER — ATENOLOL 25 MG PO TABS
25.0000 mg | ORAL_TABLET | Freq: Every evening | ORAL | 0 refills | Status: DC
  Filled 2022-12-07: qty 90, 90d supply, fill #0

## 2022-12-13 ENCOUNTER — Encounter (HOSPITAL_COMMUNITY): Payer: Self-pay

## 2022-12-13 ENCOUNTER — Other Ambulatory Visit (HOSPITAL_COMMUNITY): Payer: Self-pay

## 2023-01-09 ENCOUNTER — Other Ambulatory Visit (HOSPITAL_COMMUNITY): Payer: Self-pay

## 2023-01-09 MED ORDER — CLONAZEPAM 2 MG PO TABS
2.0000 mg | ORAL_TABLET | Freq: Every day | ORAL | 0 refills | Status: DC
  Filled 2023-01-13 (×2): qty 90, 90d supply, fill #0
  Filled ????-??-??: fill #0

## 2023-01-10 ENCOUNTER — Other Ambulatory Visit (HOSPITAL_COMMUNITY): Payer: Self-pay

## 2023-01-12 ENCOUNTER — Other Ambulatory Visit (HOSPITAL_COMMUNITY): Payer: Self-pay

## 2023-01-12 ENCOUNTER — Encounter (HOSPITAL_COMMUNITY): Payer: Self-pay

## 2023-01-13 ENCOUNTER — Other Ambulatory Visit (HOSPITAL_COMMUNITY): Payer: Self-pay

## 2023-01-28 ENCOUNTER — Emergency Department (HOSPITAL_BASED_OUTPATIENT_CLINIC_OR_DEPARTMENT_OTHER): Payer: HMO

## 2023-01-28 ENCOUNTER — Other Ambulatory Visit: Payer: Self-pay

## 2023-01-28 ENCOUNTER — Encounter (HOSPITAL_BASED_OUTPATIENT_CLINIC_OR_DEPARTMENT_OTHER): Payer: Self-pay | Admitting: Emergency Medicine

## 2023-01-28 ENCOUNTER — Emergency Department (HOSPITAL_BASED_OUTPATIENT_CLINIC_OR_DEPARTMENT_OTHER)
Admission: EM | Admit: 2023-01-28 | Discharge: 2023-01-28 | Disposition: A | Discharge status: Home / Self Care | Payer: HMO | Attending: Emergency Medicine | Admitting: Emergency Medicine

## 2023-01-28 DIAGNOSIS — Z7984 Long term (current) use of oral hypoglycemic drugs: Secondary | ICD-10-CM | POA: Diagnosis not present

## 2023-01-28 DIAGNOSIS — Z7982 Long term (current) use of aspirin: Secondary | ICD-10-CM | POA: Insufficient documentation

## 2023-01-28 DIAGNOSIS — Y92002 Bathroom of unspecified non-institutional (private) residence single-family (private) house as the place of occurrence of the external cause: Secondary | ICD-10-CM | POA: Diagnosis not present

## 2023-01-28 DIAGNOSIS — Z79899 Other long term (current) drug therapy: Secondary | ICD-10-CM | POA: Diagnosis not present

## 2023-01-28 DIAGNOSIS — W182XXA Fall in (into) shower or empty bathtub, initial encounter: Secondary | ICD-10-CM | POA: Diagnosis not present

## 2023-01-28 DIAGNOSIS — I7121 Aneurysm of the ascending aorta, without rupture: Secondary | ICD-10-CM | POA: Diagnosis not present

## 2023-01-28 DIAGNOSIS — S0093XA Contusion of unspecified part of head, initial encounter: Secondary | ICD-10-CM | POA: Insufficient documentation

## 2023-01-28 DIAGNOSIS — S299XXA Unspecified injury of thorax, initial encounter: Secondary | ICD-10-CM | POA: Diagnosis present

## 2023-01-28 DIAGNOSIS — W010XXA Fall on same level from slipping, tripping and stumbling without subsequent striking against object, initial encounter: Secondary | ICD-10-CM

## 2023-01-28 DIAGNOSIS — S2231XA Fracture of one rib, right side, initial encounter for closed fracture: Secondary | ICD-10-CM | POA: Insufficient documentation

## 2023-01-28 MED ORDER — OXYCODONE-ACETAMINOPHEN 5-325 MG PO TABS
1.0000 | ORAL_TABLET | Freq: Four times a day (QID) | ORAL | 0 refills | Status: DC | PRN
Start: 2023-01-28 — End: 2023-07-28

## 2023-01-28 MED ORDER — TRAMADOL HCL 50 MG PO TABS
50.0000 mg | ORAL_TABLET | Freq: Once | ORAL | Status: AC
  Administered 2023-01-28: 50 mg via ORAL
  Filled 2023-01-28: qty 1

## 2023-01-28 MED ORDER — TETANUS-DIPHTH-ACELL PERTUSSIS 5-2.5-18.5 LF-MCG/0.5 IM SUSY
0.5000 mL | PREFILLED_SYRINGE | Freq: Once | INTRAMUSCULAR | Status: DC
  Filled 2023-01-28: qty 0.5

## 2023-01-28 NOTE — Discharge Instructions (Addendum)
It was our pleasure to provide your ER care today - we hope that you feel better.  Your CT scan was read as showing a right 6th rib fracture.  Stay active, take full deep breaths. You may use incentive spirometer, taking 10 full and complete breaths in every hour while awake.   Incidental note was also made of a 4.1 cm ascending aorta aneurysm - discuss this  finding with your primary care doctor - follow up with your doctor or vascular specialist in the next 6 months for repeat imaging of this area.   Return to ER If worse, new symptoms, new/severe pain, trouble breathing, chest pain, fevers, or other concern.

## 2023-01-28 NOTE — ED Triage Notes (Signed)
Pt presents after slip and fall last night in the bathroom.  States he fell on his face and right side.  Did not pass out or feel faint.  No thinners.  Reports he thought he was okay but today had noticed pain with inspiration and bruising to right lower rib cage.

## 2023-01-28 NOTE — ED Notes (Signed)
RT educated pt on proper use of IS. Pt goal 1500 mLs pt able to achieve 1250 mLs with good effort. Pt BLBS clear diminished w/no distress noted.    01/28/23 1915  Incentive Spirometry Tx  Level of Service Assisted by RCP  Frequency q1hr W/A  Treatment Tolerance Tolerated well  IS Goal (mL) (RN or RT) 1500 mL  IS - Achieved (mL) (RN, NT, or RT) 1250 mL

## 2023-01-28 NOTE — ED Provider Notes (Signed)
[place] EMERGENCY DEPARTMENT AT [facility] Provider Note   CSN: [id number] Arrival date & time: 01/28/23  1535     History  Chief Complaint  Patient presents with   William Warner is a 77 y.o. male.  Pt s/p fall last evening. Was trying to pull off one shoe with the other, when rug slid and patient fell. Hit head. No loc. Dull pain to area. Also c/o pain and bruising to right lateral ribs. No anticoagulant use. States felt fine, at baseline, prior to fall. No faintness or dizziness. No loc w fall. No acute, abrupt or severe head pain. No preceding chest pain or sob. No palpitations. Since fall, pain as noted above. Denies other new pain or injury. No neck/back pain. No abd pain or nv. No extremity pain or injury.   The history is provided by the patient, medical records and the spouse.  Fall Pertinent negatives include no abdominal pain and no shortness of breath.       Home Medications Prior to Admission medications   Medication Sig Start Date End Date Taking? Authorizing Provider  oxyCODONE-acetaminophen (PERCOCET/ROXICET) 5-325 MG tablet Take 1-2 tablets by mouth every 6 (six) hours as needed for severe pain. 01/28/23  Yes Cathren Laine, MD  aspirin 81 MG tablet Take 81 mg by mouth daily.    [provider]  atenolol (TENORMIN) 25 MG tablet Take 25 mg by mouth daily.    [provider]  atenolol (TENORMIN) 25 MG tablet Take 1 tablet (25 mg total) by mouth every evening for blood pressure 12/07/22     Cholecalciferol 50 MCG (2000 UT) TBDP Take 2,000 Units by mouth daily.    [provider]  clonazePAM (KLONOPIN) 2 MG tablet Take 2 mg by mouth at bedtime.     [provider]  clonazePAM (KLONOPIN) 2 MG tablet Take 1 tablet (2 mg total) by mouth daily. 01/09/23     COVID-19 mRNA bivalent vaccine, Pfizer, (PFIZER COVID-19 VAC BIVALENT) injection Inject into the muscle. 05/29/21   Judyann Munson, MD  COVID-19 mRNA Vac-TriS,  Pfizer, SUSP injection Inject into the muscle. 11/07/20   Judyann Munson, MD  cyanocobalamin (VITAMIN B12) 500 MCG tablet Take 500 mcg by mouth daily.    [provider]  EPINEPHrine (EPIPEN JR) 0.15 MG/0.3ML injection Inject 0.15 mg into the muscle as needed.    [provider]  esomeprazole (NEXIUM) 40 MG capsule Take 40 mg by mouth daily before breakfast.    [provider]  felodipine (PLENDIL) 5 MG 24 hr tablet Take 5 mg by mouth daily.    [provider]  gemfibrozil (LOPID) 600 MG tablet Take 600 mg by mouth daily.     [provider]  gemfibrozil (LOPID) 600 MG tablet Take 1 tablet (600 mg total) by mouth every evening for triglycerides 12/07/22     ibuprofen (ADVIL) 600 MG tablet Take 1 tablet (600 mg total) by mouth every 6 (six) hours as needed. 08/26/22   Barrie Dunker B, PA-C  lidocaine (LIDODERM) 5 % Place 1 patch onto the skin daily. Remove & Discard patch within 12 hours or as directed by MD 03/20/22   Terrilee Files, MD  Multiple Vitamin tablet Take 1 tablet by mouth daily.    [provider]  nitroGLYCERIN (NITROSTAT) 0.4 MG SL tablet DISSOLVE 1 TABLET UNDER THE TONGUE EVERY 5 MINUTES AS NEEDED FOR CHEST PAIN. DO NOT EXCEED A TOTAL OF 3 DOSES IN  15 MINUTES. 06/24/21   Patwardhan, Anabel Bene, MD  ondansetron (ZOFRAN) 4 MG tablet Take 1 tablet (4 mg total) by mouth every 8 (eight) hours as needed for nausea or vomiting. 08/26/22   Darrick Grinder, PA-C  oxyCODONE (ROXICODONE) 5 MG immediate release tablet Take 1 tablet (5 mg total) by mouth every 4 (four) hours as needed for severe pain. 08/26/22   Barrie Dunker B, PA-C  rosuvastatin (CRESTOR) 20 MG tablet TAKE ONE TABLET BY MOUTH ONCE DAILY 05/21/21   Patwardhan, Anabel Bene, MD  rosuvastatin (CRESTOR) 20 MG tablet Take 1 tablet (20 mg total) by mouth daily. 12/07/22     tamsulosin (FLOMAX) 0.4 MG CAPS capsule Take 1 capsule (0.4 mg total) by mouth daily. 06/25/14   Phillis Haggis, MD   tamsulosin (FLOMAX) 0.4 MG CAPS capsule Take 1 capsule (0.4 mg total) by mouth every evening. 12/07/22         Allergies    Bee venom and Augmentin [amoxicillin-pot clavulanate]    Review of Systems   Review of Systems  HENT:  Negative for nosebleeds.   Respiratory:  Negative for shortness of breath.   Cardiovascular:  Negative for leg swelling.       Right chest/rib contusion, no other chest pain or discomfort.   Gastrointestinal:  Negative for abdominal pain and vomiting.  Musculoskeletal:  Negative for back pain and neck pain.  Neurological:  Negative for weakness and numbness.  Hematological:  Does not bruise/bleed easily.  Psychiatric/Behavioral:  Negative for confusion.     Physical Exam Updated Vital Signs BP (!) 145/75 (BP Location: Right Arm)   Pulse (!) 51   Temp (!) 97.5 F (36.4 C) (Oral)   Resp 18   SpO2 93%  Physical Exam Vitals and nursing note reviewed.  Constitutional:      Appearance: Normal appearance. He is well-developed.  HENT:     Head:     Comments: Contusion/abrasion to right forehead.     Nose: Nose normal.     Mouth/Throat:     Mouth: Mucous membranes are moist.     Pharynx: Oropharynx is clear.  Eyes:     General: No scleral icterus.    Conjunctiva/sclera: Conjunctivae normal.     Pupils: Pupils are equal, round, and reactive to light.  Neck:     Vascular: No carotid bruit.     Trachea: No tracheal deviation.  Cardiovascular:     Rate and Rhythm: Normal rate and regular rhythm.     Pulses: Normal pulses.     Heart sounds: Normal heart sounds. No murmur heard.    No friction rub. No gallop.  Pulmonary:     Effort: Pulmonary effort is normal. No accessory muscle usage or respiratory distress.     Breath sounds: Normal breath sounds.     Comments: Bruising and right lower/lateral chest wall tenderness.  Chest:     Chest wall: Tenderness present.  Abdominal:     General: There is no distension.     Palpations: Abdomen is soft.      Tenderness: There is no abdominal tenderness.     Comments: No abd contusion or bruising.   Genitourinary:    Comments: No cva tenderness. Musculoskeletal:        General: No swelling.     Cervical back: Normal range of motion and neck supple. No rigidity or tenderness.     Comments: CTLS spine, non tender, aligned, no step off. Good rom bil extremities without pain or  focal bony tenderness.   Skin:    General: Skin is warm and dry.     Findings: No rash.  Neurological:     Mental Status: He is alert.     Comments: Alert, speech clear. GCS 15. Motor/sens grossly intact bil.   Psychiatric:        Mood and Affect: Mood normal.     ED Results / Procedures / Treatments   Labs (all labs ordered are listed, but only abnormal results are displayed) Labs Reviewed - No data to display  EKG None  Radiology CT Chest Wo Contrast  Result Date: 01/28/2023 CLINICAL DATA:  Chest trauma blunt fall assess for rib fracture EXAM: CT CHEST WITHOUT CONTRAST TECHNIQUE: Multidetector CT imaging of the chest was performed following the standard protocol without IV contrast. RADIATION DOSE REDUCTION: This exam was performed according to the departmental dose-optimization program which includes automated exposure control, adjustment of the mA and/or kV according to patient size and/or use of iterative reconstruction technique. COMPARISON:  CT 11/01/2022 FINDINGS: Cardiovascular: Limited assessment without intravenous contrast. Mild dilatation of ascending aorta up to 4.1 cm. Moderate aortic atherosclerosis. Coronary vascular calcification. Small pericardial effusion Mediastinum/Nodes: Midline trachea. No thyroid mass. No suspicious lymph nodes. Esophagus within normal limits. Lungs/Pleura: Emphysema. No acute airspace disease, pleural effusion or pneumothorax Upper Abdomen: No acute finding.  Small left kidney stone. Musculoskeletal: Suspected acute nondisplaced right sixth anterior rib fracture on series 3,  image 108. Thoracic alignment is within normal limits. Sternum appears intact IMPRESSION: Suspected acute nondisplaced right sixth anterior rib fracture. Negative for pneumothorax or pleural effusion Ascending aortic diameter up to 4.1 cm. Recommend annual imaging followup by CTA or MRA. This recommendation follows 2010 ACCF/AHA/AATS/ACR/ASA/SCA/SCAI/SIR/STS/SVM Guidelines for the Diagnosis and Management of Patients with Thoracic Aortic Disease. Circulation. 2010; 121: Z610-R604. Aortic aneurysm NOS (ICD10-I71.9) Aortic Atherosclerosis (ICD10-I70.0) and Emphysema (ICD10-J43.9). Electronically Signed   By: Jasmine Pang M.D.   On: 01/28/2023 17:52   CT Head Wo Contrast  Result Date: 01/28/2023 CLINICAL DATA:  Head trauma EXAM: CT HEAD WITHOUT CONTRAST TECHNIQUE: Contiguous axial images were obtained from the base of the skull through the vertex without intravenous contrast. RADIATION DOSE REDUCTION: This exam was performed according to the departmental dose-optimization program which includes automated exposure control, adjustment of the mA and/or kV according to patient size and/or use of iterative reconstruction technique. COMPARISON:  03/11/2017 FINDINGS: Brain: No evidence of acute infarction, hemorrhage, mass, mass effect, or midline shift. No hydrocephalus or extra-axial fluid collection. Vascular: No hyperdense vessel. Skull: Negative for fracture or focal lesion. Sinuses/Orbits: Mild mucosal thickening in the right maxillary sinus. No acute finding in the orbits Other: The mastoid air cells are well aerated. IMPRESSION: No acute intracranial process. Electronically Signed   By: Wiliam Ke M.D.   On: 01/28/2023 17:40    Procedures Procedures    Medications Ordered in ED Medications  Tdap (BOOSTRIX) injection 0.5 mL (0.5 mLs Intramuscular Not Given 01/28/23 1627)  traMADol (ULTRAM) tablet 50 mg (50 mg Oral Given 01/28/23 1626)    ED Course/ Medical Decision Making/ A&P                                  Medical Decision Making Problems Addressed: Aneurysm of ascending aorta without rupture [facility]): chronic illness or injury with exacerbation, progression, or side effects of treatment that poses a threat to life or bodily functions Closed fracture of one rib  of right side, initial encounter: acute illness or injury with systemic symptoms that poses a threat to life or bodily functions Contusion of head, initial encounter: acute illness or injury with systemic symptoms that poses a threat to life or bodily functions Fall from slip, trip, or stumble, initial encounter: acute illness or injury with systemic symptoms that poses a threat to life or bodily functions  Amount and/or Complexity of Data Reviewed Independent Historian: spouse    Details: hx External Data Reviewed: notes. Radiology: ordered and independent interpretation performed. Decision-making details documented in ED Course.  Risk Prescription drug management. Decision regarding hospitalization.  Imaging ordered.   Differential diagnosis includes rib fractures, ptx, head injury/sdh, etc. Dispo decision including potential need for admission considered - will get imaging and reassess.   Reviewed nursing notes and prior charts for additional history. External reports reviewed. Additional history from: spouse.   CT reviewed/interpreted by me - right rib fx. Incidental note of 4.1 cm ascending aorta aneurysm - shared w patient, rec pcp/vascular f/u.   Pt breathing comfortably. Pain improved.   Pt indicates ultram does not seem to help, requests other pain rx - provided.  Incentive spirometer for home.   Pt currently appears stable for d/c.   Rec close pcp f/u.  Return precautions provided.          Final Clinical Impression(s) / ED Diagnoses Final diagnoses:  Fall from slip, trip, or stumble, initial encounter  Closed fracture of one rib of right side, initial encounter  Aneurysm of ascending aorta  without rupture (HCC)  Contusion of head, initial encounter    Rx / DC Orders ED Discharge Orders          Ordered    oxyCODONE-acetaminophen (PERCOCET/ROXICET) 5-325 MG tablet  Every 6 hours PRN        01/28/23 1906              Cathren Laine, MD 01/28/23 1908

## 2023-03-07 ENCOUNTER — Other Ambulatory Visit: Payer: Self-pay

## 2023-03-07 ENCOUNTER — Other Ambulatory Visit (HOSPITAL_COMMUNITY): Payer: Self-pay

## 2023-03-07 MED ORDER — TAMSULOSIN HCL 0.4 MG PO CAPS
0.4000 mg | ORAL_CAPSULE | Freq: Every evening | ORAL | 3 refills | Status: DC
  Filled 2023-03-07: qty 90, 90d supply, fill #0
  Filled 2023-06-02 (×2): qty 90, 90d supply, fill #1
  Filled 2023-08-30: qty 90, 90d supply, fill #2
  Filled 2023-11-28: qty 90, 90d supply, fill #3

## 2023-03-07 MED ORDER — ATENOLOL 25 MG PO TABS
25.0000 mg | ORAL_TABLET | Freq: Every day | ORAL | 3 refills | Status: DC
  Filled 2023-03-07: qty 90, 90d supply, fill #0
  Filled 2023-06-02 (×2): qty 90, 90d supply, fill #1
  Filled 2023-08-30: qty 90, 90d supply, fill #2
  Filled 2023-11-28: qty 90, 90d supply, fill #3

## 2023-03-07 MED ORDER — ROSUVASTATIN CALCIUM 20 MG PO TABS
20.0000 mg | ORAL_TABLET | Freq: Every day | ORAL | 3 refills | Status: DC
  Filled 2023-03-07: qty 90, 90d supply, fill #0
  Filled 2023-06-02 (×2): qty 90, 90d supply, fill #1
  Filled 2023-08-30: qty 90, 90d supply, fill #2
  Filled 2023-11-28: qty 90, 90d supply, fill #3

## 2023-03-07 MED ORDER — GEMFIBROZIL 600 MG PO TABS
600.0000 mg | ORAL_TABLET | Freq: Every evening | ORAL | 3 refills | Status: DC
  Filled 2023-03-07: qty 90, 90d supply, fill #0
  Filled 2023-06-02 (×2): qty 90, 90d supply, fill #1
  Filled 2023-08-30: qty 90, 90d supply, fill #2
  Filled 2023-11-28: qty 90, 90d supply, fill #3

## 2023-03-10 NOTE — Plan of Care (Signed)
[facility] Tonsillectomy/Adenoidectomy, Postoperative PEDS care plan entered in error.

## 2023-04-11 ENCOUNTER — Other Ambulatory Visit (HOSPITAL_COMMUNITY): Payer: Self-pay

## 2023-04-12 ENCOUNTER — Other Ambulatory Visit (HOSPITAL_COMMUNITY): Payer: Self-pay

## 2023-04-12 MED ORDER — CLONAZEPAM 2 MG PO TABS
2.0000 mg | ORAL_TABLET | Freq: Every day | ORAL | 0 refills | Status: DC | PRN
  Filled 2023-04-12 – 2023-04-13 (×2): qty 90, 90d supply, fill #0

## 2023-04-13 ENCOUNTER — Other Ambulatory Visit: Payer: Self-pay

## 2023-04-13 ENCOUNTER — Other Ambulatory Visit (HOSPITAL_COMMUNITY): Payer: Self-pay

## 2023-05-02 ENCOUNTER — Ambulatory Visit: Payer: Self-pay | Admitting: Cardiology

## 2023-05-27 DIAGNOSIS — N200 Calculus of kidney: Secondary | ICD-10-CM | POA: Diagnosis not present

## 2023-06-02 ENCOUNTER — Other Ambulatory Visit (HOSPITAL_COMMUNITY): Payer: Self-pay

## 2023-06-03 ENCOUNTER — Encounter: Payer: Self-pay | Admitting: Pharmacist

## 2023-06-03 ENCOUNTER — Other Ambulatory Visit (HOSPITAL_COMMUNITY): Payer: Self-pay

## 2023-06-03 ENCOUNTER — Other Ambulatory Visit: Payer: Self-pay

## 2023-06-21 DIAGNOSIS — N200 Calculus of kidney: Secondary | ICD-10-CM | POA: Diagnosis not present

## 2023-07-07 ENCOUNTER — Other Ambulatory Visit (HOSPITAL_COMMUNITY): Payer: Self-pay

## 2023-07-08 ENCOUNTER — Other Ambulatory Visit (HOSPITAL_COMMUNITY): Payer: Self-pay

## 2023-07-08 ENCOUNTER — Other Ambulatory Visit: Payer: Self-pay

## 2023-07-08 MED ORDER — CLONAZEPAM 2 MG PO TABS
2.0000 mg | ORAL_TABLET | Freq: Every day | ORAL | 0 refills | Status: AC | PRN
  Filled 2023-07-08 – 2023-07-11 (×5): qty 90, 90d supply, fill #0

## 2023-07-09 ENCOUNTER — Other Ambulatory Visit (HOSPITAL_COMMUNITY): Payer: Self-pay

## 2023-07-11 ENCOUNTER — Other Ambulatory Visit: Payer: Self-pay

## 2023-07-11 ENCOUNTER — Other Ambulatory Visit (HOSPITAL_COMMUNITY): Payer: Self-pay

## 2023-07-12 ENCOUNTER — Other Ambulatory Visit (HOSPITAL_COMMUNITY): Payer: Self-pay

## 2023-07-27 DIAGNOSIS — N200 Calculus of kidney: Secondary | ICD-10-CM | POA: Diagnosis not present

## 2023-07-27 DIAGNOSIS — R3121 Asymptomatic microscopic hematuria: Secondary | ICD-10-CM | POA: Diagnosis not present

## 2023-07-27 DIAGNOSIS — M5459 Other low back pain: Secondary | ICD-10-CM | POA: Diagnosis not present

## 2023-07-28 ENCOUNTER — Encounter: Payer: Self-pay | Admitting: Cardiology

## 2023-07-28 ENCOUNTER — Ambulatory Visit: Payer: HMO | Attending: Cardiology | Admitting: Cardiology

## 2023-07-28 VITALS — BP 114/70 | HR 53 | Ht 70.0 in | Wt 186.0 lb

## 2023-07-28 DIAGNOSIS — I7121 Aneurysm of the ascending aorta, without rupture: Secondary | ICD-10-CM | POA: Insufficient documentation

## 2023-07-28 DIAGNOSIS — I10 Essential (primary) hypertension: Secondary | ICD-10-CM | POA: Diagnosis not present

## 2023-07-28 DIAGNOSIS — I700 Atherosclerosis of aorta: Secondary | ICD-10-CM

## 2023-07-28 NOTE — Patient Instructions (Signed)
Medication Instructions:   Your physician recommends that you continue on your current medications as directed. Please refer to the Current Medication list given to you today.  *If you need a refill on your cardiac medications before your next appointment, please call your pharmacy*    Follow-Up: At [facility], you and your health needs are our priority.  As part of our continuing mission to provide you with exceptional heart care, we have created designated Provider Care Teams.  These Care Teams include your primary Cardiologist (physician) and Advanced Practice Providers (APPs -  Physician Assistants and Nurse Practitioners) who all work together to provide you with the care you need, when you need it.  We recommend signing up for the patient portal called "MyChart".  Sign up information is provided on this After Visit Summary.  MyChart is used to connect with patients for Virtual Visits (Telemedicine).  Patients are able to view lab/test results, encounter notes, upcoming appointments, etc.  Non-urgent messages can be sent to your provider as well.   To learn more about what you can do with MyChart, go to [email or web address].    Your next appointment:   1 year(s)  Provider:   Dr. Rosemary Holms

## 2023-07-28 NOTE — Progress Notes (Signed)
  Cardiology Office Note:  .   Date:  07/28/2023  ID:  William Warner, DOB 03-Oct-1945, MRN [id number] PCP: Thana Ates, MD  [facility] Providers Cardiologist:  Truett Mainland, MD PCP: Thana Ates, MD  Chief Complaint  Patient presents with   TAA      History of Present Illness: .    William Warner is a 78 y.o. male with hypertension, stable TAA, AAA, aortic atherosclerosis, mixed hyperlipidemia, h/o tobacco abuse   Patient has multiple noncardiac complaints, including hematuria, limiting back pain, for which she is seeing specialist including neurologist and spine surgeons.  He denies any chest pain, shortness of breath symptoms.  He is compliant with his medical therapy.  Recent CT chest obtained for other reasons in 01/2023 showed stable TAA at 4.1 cm.  Vitals:   07/28/23 1451  BP: 114/70  Pulse: (!) 53  SpO2: 95%     ROS:  Review of Systems  Cardiovascular:  Negative for chest pain, dyspnea on exertion, leg swelling, palpitations and syncope.  Musculoskeletal:  Positive for back pain.  Genitourinary:  Positive for hematuria.     Studies Reviewed: Marland Kitchen        EKG 07/28/2023: Sinus bradycardia When compared with ECG of 24-Sep-2019 21:00, QT has shortened    Independently interpreted 7-03/2023: Chol 146, TG 208, HDL 52, LDL 60 HbA1C 5.7% Hb 14.4 Cr 0.8     Physical Exam:   Physical Exam Vitals and nursing note reviewed.  Constitutional:      General: He is not in acute distress. Neck:     Vascular: No JVD.  Cardiovascular:     Rate and Rhythm: Normal rate and regular rhythm.     Heart sounds: Normal heart sounds. No murmur heard. Pulmonary:     Effort: Pulmonary effort is normal.     Breath sounds: Normal breath sounds. No wheezing or rales.  Musculoskeletal:     Right lower leg: No edema.     Left lower leg: No edema.      VISIT DIAGNOSES:   ICD-10-CM   1. Primary hypertension  I10 EKG 12-Lead    2. Aneurysm of ascending  aorta without rupture (HCC)  I71.21     3. Aortic atherosclerosis (HCC)  I70.0        ASSESSMENT AND PLAN: .    William Warner is a 78 y.o. male with hypertension, stable TAA, AAA, aortic atherosclerosis, mixed hyperlipidemia, h/o tobacco abuse   Coronary calcification, aortic atherosclerosis: No angina symptoms. Currently on aspirin 81 mg daily.  Reasonable to hold to see if hematuria improves.  From my understanding, no sinister cause has been found for his hematuria from his neurological workup. Continue statin, lipids well-controlled.   Hypertension: Controlled   TAA, AAA: Small AAA, repeat ultrasound can be considered in 5 to 10 years. TAA has historically stayed stable around 4.1-4.2 cm.  Once a year CT is reasonable, can order at next visit.   F/u in 1 year  Signed, Elder Negus, MD

## 2023-08-30 ENCOUNTER — Other Ambulatory Visit (HOSPITAL_COMMUNITY): Payer: Self-pay

## 2023-10-25 ENCOUNTER — Encounter: Payer: Self-pay | Admitting: Internal Medicine

## 2023-10-26 DIAGNOSIS — K219 Gastro-esophageal reflux disease without esophagitis: Secondary | ICD-10-CM | POA: Diagnosis not present

## 2023-10-26 DIAGNOSIS — I714 Abdominal aortic aneurysm, without rupture, unspecified: Secondary | ICD-10-CM | POA: Diagnosis not present

## 2023-10-26 DIAGNOSIS — Z86018 Personal history of other benign neoplasm: Secondary | ICD-10-CM | POA: Diagnosis not present

## 2023-11-02 DIAGNOSIS — Z860101 Personal history of adenomatous and serrated colon polyps: Secondary | ICD-10-CM | POA: Diagnosis not present

## 2023-11-02 DIAGNOSIS — Z09 Encounter for follow-up examination after completed treatment for conditions other than malignant neoplasm: Secondary | ICD-10-CM | POA: Diagnosis not present

## 2023-11-02 DIAGNOSIS — K648 Other hemorrhoids: Secondary | ICD-10-CM | POA: Diagnosis not present

## 2023-11-02 DIAGNOSIS — K644 Residual hemorrhoidal skin tags: Secondary | ICD-10-CM | POA: Diagnosis not present

## 2023-11-02 DIAGNOSIS — K5730 Diverticulosis of large intestine without perforation or abscess without bleeding: Secondary | ICD-10-CM | POA: Diagnosis not present

## 2023-11-16 DIAGNOSIS — D2239 Melanocytic nevi of other parts of face: Secondary | ICD-10-CM | POA: Diagnosis not present

## 2023-11-16 DIAGNOSIS — D171 Benign lipomatous neoplasm of skin and subcutaneous tissue of trunk: Secondary | ICD-10-CM | POA: Diagnosis not present

## 2023-11-16 DIAGNOSIS — Z85828 Personal history of other malignant neoplasm of skin: Secondary | ICD-10-CM | POA: Diagnosis not present

## 2023-11-16 DIAGNOSIS — L218 Other seborrheic dermatitis: Secondary | ICD-10-CM | POA: Diagnosis not present

## 2023-11-16 DIAGNOSIS — D485 Neoplasm of uncertain behavior of skin: Secondary | ICD-10-CM | POA: Diagnosis not present

## 2023-11-16 DIAGNOSIS — L821 Other seborrheic keratosis: Secondary | ICD-10-CM | POA: Diagnosis not present

## 2023-11-16 DIAGNOSIS — C44719 Basal cell carcinoma of skin of left lower limb, including hip: Secondary | ICD-10-CM | POA: Diagnosis not present

## 2023-11-28 ENCOUNTER — Other Ambulatory Visit (HOSPITAL_COMMUNITY): Payer: Self-pay

## 2023-11-30 DIAGNOSIS — Z122 Encounter for screening for malignant neoplasm of respiratory organs: Secondary | ICD-10-CM | POA: Diagnosis not present

## 2023-11-30 DIAGNOSIS — I25118 Atherosclerotic heart disease of native coronary artery with other forms of angina pectoris: Secondary | ICD-10-CM | POA: Diagnosis not present

## 2023-11-30 DIAGNOSIS — R1031 Right lower quadrant pain: Secondary | ICD-10-CM | POA: Diagnosis not present

## 2023-11-30 DIAGNOSIS — Z125 Encounter for screening for malignant neoplasm of prostate: Secondary | ICD-10-CM | POA: Diagnosis not present

## 2023-11-30 DIAGNOSIS — Z79899 Other long term (current) drug therapy: Secondary | ICD-10-CM | POA: Diagnosis not present

## 2023-11-30 DIAGNOSIS — R3129 Other microscopic hematuria: Secondary | ICD-10-CM | POA: Diagnosis not present

## 2023-11-30 DIAGNOSIS — E559 Vitamin D deficiency, unspecified: Secondary | ICD-10-CM | POA: Diagnosis not present

## 2023-11-30 DIAGNOSIS — Z8042 Family history of malignant neoplasm of prostate: Secondary | ICD-10-CM | POA: Diagnosis not present

## 2023-11-30 DIAGNOSIS — G629 Polyneuropathy, unspecified: Secondary | ICD-10-CM | POA: Diagnosis not present

## 2023-11-30 DIAGNOSIS — I10 Essential (primary) hypertension: Secondary | ICD-10-CM | POA: Diagnosis not present

## 2023-11-30 DIAGNOSIS — J439 Emphysema, unspecified: Secondary | ICD-10-CM | POA: Diagnosis not present

## 2023-11-30 DIAGNOSIS — R7303 Prediabetes: Secondary | ICD-10-CM | POA: Diagnosis not present

## 2023-11-30 DIAGNOSIS — M545 Low back pain, unspecified: Secondary | ICD-10-CM | POA: Diagnosis not present

## 2023-11-30 DIAGNOSIS — I7143 Infrarenal abdominal aortic aneurysm, without rupture: Secondary | ICD-10-CM | POA: Diagnosis not present

## 2023-12-01 ENCOUNTER — Other Ambulatory Visit: Payer: Self-pay | Admitting: Internal Medicine

## 2023-12-01 DIAGNOSIS — Z122 Encounter for screening for malignant neoplasm of respiratory organs: Secondary | ICD-10-CM

## 2023-12-08 ENCOUNTER — Ambulatory Visit
Admission: RE | Admit: 2023-12-08 | Discharge: 2023-12-08 | Disposition: A | Discharge status: Home / Self Care | Source: Ambulatory Visit | Attending: Internal Medicine | Admitting: Internal Medicine

## 2023-12-08 DIAGNOSIS — R918 Other nonspecific abnormal finding of lung field: Secondary | ICD-10-CM | POA: Diagnosis not present

## 2023-12-08 DIAGNOSIS — I7121 Aneurysm of the ascending aorta, without rupture: Secondary | ICD-10-CM | POA: Diagnosis not present

## 2023-12-08 DIAGNOSIS — Z122 Encounter for screening for malignant neoplasm of respiratory organs: Secondary | ICD-10-CM

## 2023-12-21 ENCOUNTER — Other Ambulatory Visit (HOSPITAL_BASED_OUTPATIENT_CLINIC_OR_DEPARTMENT_OTHER): Payer: Self-pay | Admitting: Internal Medicine

## 2023-12-21 DIAGNOSIS — N1330 Unspecified hydronephrosis: Secondary | ICD-10-CM

## 2023-12-22 ENCOUNTER — Ambulatory Visit (HOSPITAL_COMMUNITY)
Admission: RE | Admit: 2023-12-22 | Discharge: 2023-12-22 | Disposition: A | Discharge status: Home / Self Care | Source: Ambulatory Visit | Attending: Internal Medicine | Admitting: Internal Medicine

## 2023-12-22 DIAGNOSIS — I7143 Infrarenal abdominal aortic aneurysm, without rupture: Secondary | ICD-10-CM | POA: Diagnosis not present

## 2023-12-22 DIAGNOSIS — K5792 Diverticulitis of intestine, part unspecified, without perforation or abscess without bleeding: Secondary | ICD-10-CM | POA: Diagnosis not present

## 2023-12-22 DIAGNOSIS — N132 Hydronephrosis with renal and ureteral calculous obstruction: Secondary | ICD-10-CM | POA: Diagnosis not present

## 2023-12-22 DIAGNOSIS — N1330 Unspecified hydronephrosis: Secondary | ICD-10-CM | POA: Insufficient documentation

## 2023-12-22 DIAGNOSIS — K5730 Diverticulosis of large intestine without perforation or abscess without bleeding: Secondary | ICD-10-CM | POA: Diagnosis not present

## 2023-12-28 DIAGNOSIS — N201 Calculus of ureter: Secondary | ICD-10-CM | POA: Diagnosis not present

## 2023-12-28 DIAGNOSIS — N200 Calculus of kidney: Secondary | ICD-10-CM | POA: Diagnosis not present

## 2024-01-04 ENCOUNTER — Other Ambulatory Visit: Payer: Self-pay | Admitting: Urology

## 2024-01-13 ENCOUNTER — Encounter (HOSPITAL_COMMUNITY): Payer: Self-pay

## 2024-01-13 ENCOUNTER — Other Ambulatory Visit: Payer: Self-pay

## 2024-01-13 ENCOUNTER — Encounter (HOSPITAL_COMMUNITY)
Admission: RE | Admit: 2024-01-13 | Discharge: 2024-01-13 | Disposition: A | Discharge status: Home / Self Care | Source: Ambulatory Visit | Attending: Urology | Admitting: Urology

## 2024-01-13 VITALS — BP 133/68 | HR 58 | Temp 97.9°F | Resp 18 | Ht 70.0 in | Wt 175.0 lb

## 2024-01-13 DIAGNOSIS — Z01812 Encounter for preprocedural laboratory examination: Secondary | ICD-10-CM | POA: Insufficient documentation

## 2024-01-13 DIAGNOSIS — I10 Essential (primary) hypertension: Secondary | ICD-10-CM | POA: Diagnosis not present

## 2024-01-13 HISTORY — DX: Personal history of urinary calculi: Z87.442

## 2024-01-13 HISTORY — DX: Malignant (primary) neoplasm, unspecified: C80.1

## 2024-01-13 LAB — BASIC METABOLIC PANEL WITH GFR
Anion gap: 12 (ref 5–15)
BUN: 11 mg/dL (ref 8–23)
CO2: 23 mmol/L (ref 22–32)
Calcium: 9.4 mg/dL (ref 8.9–10.3)
Chloride: 104 mmol/L (ref 98–111)
Creatinine, Ser: 0.85 mg/dL (ref 0.61–1.24)
GFR, Estimated: >60 mL/min (ref >60)
Glucose, Bld: 93 mg/dL (ref 70–99)
Potassium: 5 mmol/L (ref 3.5–5.1)
Sodium: 139 mmol/L (ref 135–145)

## 2024-01-13 LAB — CBC
HCT: 45.9 % (ref 39.0–52.0)
Hemoglobin: 14.3 g/dL (ref 13.0–17.0)
MCH: 30.2 pg (ref 26.0–34.0)
MCHC: 31.2 g/dL (ref 30.0–36.0)
MCV: 97 fL (ref 80.0–100.0)
Platelets: 174 K/uL (ref 150–400)
RBC: 4.73 MIL/uL (ref 4.22–5.81)
RDW: 12.3 % (ref 11.5–15.5)
WBC: 5.8 K/uL (ref 4.0–10.5)
nRBC: 0 % (ref 0.0–0.2)

## 2024-01-13 NOTE — Progress Notes (Incomplete)
 Anesthesia Chart Review   Case: [id number] Date/Time: 01/17/24 1315   Procedure: CYSTOSCOPY/URETEROSCOPY/HOLMIUM LASER/STENT PLACEMENT (Right) - CYSTOSCOPY/RIGHT URETEROSCOPY/HOLMIUM LASER/STENT PLACEMENT/RETROGRADE PYELOGRAM   Anesthesia type: General   Diagnosis: Ureteral stone [N20.1]   Pre-op diagnosis: RIGHT URETERAL CALCULUS   Location: [facility] PROCEDURE [facility] / [facility]   Surgeons: Elisabeth Valli BIRCH, MD       DISCUSSION:78 y.o. former smoker with h/o HTN,stable TAA, AAA, PVD, right ureteral calculus scheduled for above procedure 01/17/24 with Dr. Valli Elisabeth.   Pt last seen by cardiology 07/28/23. Denies chest pain or shortness of breath at this visit. Recent CT chest obtained for other reasons in 01/2023 showed stable TAA at 4.1 cm. Small AAA, repeat ultrasound can be considered in 5 to 10 years. Pt reports at PAT visit he can climb a flight of stairs without difficulty, denies chest pain or shortness of breath.   VS: BP 133/68   Pulse (!) 58   Temp 36.6 C (Oral)   Resp 18   Ht 5' 10 (1.778 m)   SpO2 99%   BMI 26.69 kg/m   PROVIDERS: Dwight Trula SQUIBB, MD is PCP   Cardiologist:  Newman Lawrence, MD  LABS: {[facility] AN LABS REVIEWED:112001::Labs reviewed: Acceptable for surgery.} (all labs ordered are listed, but only abnormal results are displayed)  Labs Reviewed  BASIC METABOLIC PANEL WITH GFR  CBC     IMAGES:   EKG:   CV: Echocardiogram 05/26/2021: Normal LV systolic function with visual EF 60-65%. Left ventricle cavity is normal in size. Normal left ventricular wall thickness. Normal global wall motion. Normal diastolic filling pattern, normal LAP. Mild pulmonic regurgitation. Trivial pericardial effusion. There is no hemodynamic significance. Compared to study 11/06/2018: Mild PR and trivial pericardial effusion are new finding; otherwise, no significant change.   Exercise/Lexican Tetrofosmin stress test 06/29/2021: Exercise nuclear stress test was performed  using Bruce protocol, with injection of IV Lexiscan  due to inadequate heart rate response. Patient reached 7 METS, and 79% of age predicted maximum heart rate. Exercise capacity was fair. No chest pain reported. Dyspnea reported. Suboptimal heart rate response. Lower blood pressure ntoed post injection. Stress EKG revealed no ischemic changes. SPECT images show medium sized, severe intensity, reversible perfusion defect in mid to apical, inferolateral myocardium. Stress LVEF 68%. Intermediate risk study. Past Medical History:  Diagnosis Date   AAA (abdominal aortic aneurysm) (HCC)    Allergy    B12 deficiency    DJD (degenerative joint disease)    DJD (degenerative joint disease)    DOE (dyspnea on exertion)    Erectile dysfunction    GERD (gastroesophageal reflux disease)    Hearing loss    Hemorrhagic gastritis    Hx of colonoscopy 2007   Hyperlipidemia    Hypertension    Insomnia    Kidney stone 08/2010   Lumbago    Other B-complex deficiencies    Peripheral vascular disease (HCC)    PVD (peripheral vascular disease) (HCC)    Renal calculi    Vitamin D deficiency     Past Surgical History:  Procedure Laterality Date   BASAL CELL CARCINOMA EXCISION  09/2019   COLONOSCOPY     KNEE SURGERY     TONSILLECTOMY      MEDICATIONS:  acetaminophen  (TYLENOL ) 500 MG tablet   cyanocobalamin (VITAMIN B12) 500 MCG tablet   aspirin 81 MG tablet   atenolol  (TENORMIN ) 25 MG tablet   Cholecalciferol 50 MCG (2000 UT) TBDP   clonazePAM  (KLONOPIN ) 2 MG tablet  EPINEPHrine (EPIPEN JR) 0.15 MG/0.3ML injection   gemfibrozil  (LOPID ) 600 MG tablet   ibuprofen  (ADVIL ) 600 MG tablet   Multiple Vitamin tablet   nitroGLYCERIN  (NITROSTAT ) 0.4 MG SL tablet   rosuvastatin  (CRESTOR ) 20 MG tablet   tamsulosin  (FLOMAX ) 0.4 MG CAPS capsule   No current facility-administered medications for this encounter.     Harlene Hoots Ward, PA-C [facility]Surgical [facility] [phone]

## 2024-01-13 NOTE — Patient Instructions (Addendum)
 SURGICAL WAITING ROOM VISITATION  Patients having surgery or a procedure may have no more than 2 support people in the waiting area - these visitors may rotate.    Children under the age of 64 must have an adult with them who is not the patient.  Visitors with respiratory illnesses are discouraged from visiting and should remain at home.  If the patient needs to stay at the hospital during part of their recovery, the visitor guidelines for inpatient rooms apply. Pre-op nurse will coordinate an appropriate time for 1 support person to accompany patient in pre-op.  This support person may not rotate.    Please refer to the [facility] website for the visitor guidelines for Inpatients (after your surgery is over and you are in a regular room).    Your procedure is scheduled on: 01/17/24   Report to [facility] Main Entrance    Report to admitting at 11:15 AM   Call this number if you have problems the morning of surgery [phone]   Do not eat food or drink liquids :After Midnight.          If you have questions, please contact your surgeon's office.   FOLLOW BOWEL PREP AND ANY ADDITIONAL PRE OP INSTRUCTIONS YOU RECEIVED FROM YOUR SURGEON'S OFFICE!!!     Oral Hygiene is also important to reduce your risk of infection.                                    Remember - BRUSH YOUR TEETH THE MORNING OF SURGERY WITH YOUR REGULAR TOOTHPASTE  DENTURES WILL BE REMOVED PRIOR TO SURGERY PLEASE DO NOT APPLY Poly grip OR ADHESIVES!!!   Stop all vitamins and herbal supplements 7 days before surgery.   Take these medicines the morning of surgery with A SIP OF WATER: Atenolol , Clonazepam , Felodipine, Rosuvastatin              You may not have any metal on your body including jewelry, and body piercing             Do not wear lotions, powders, cologne, or deodorant              Men may shave face and neck.   Do not bring valuables to the hospital. [place] IS NOT              RESPONSIBLE   FOR VALUABLES.   Contacts, glasses, dentures or bridgework may not be worn into surgery.  DO NOT BRING YOUR HOME MEDICATIONS TO THE HOSPITAL. PHARMACY WILL DISPENSE MEDICATIONS LISTED ON YOUR MEDICATION LIST TO YOU DURING YOUR ADMISSION IN THE HOSPITAL!    Patients discharged on the day of surgery will not be allowed to drive home.  Someone NEEDS to stay with you for the first 24 hours after anesthesia.   Special Instructions: Bring a copy of your healthcare power of attorney and living will documents the day of surgery if you haven't scanned them before.              Please read over the following fact sheets you were given: IF YOU HAVE QUESTIONS ABOUT YOUR PRE-OP INSTRUCTIONS PLEASE CALL [phone].   If you received a COVID test during your pre-op visit  it is requested that you wear a mask when out in public, stay away from anyone that may not be feeling well and notify your surgeon if you  develop symptoms. If you test positive for Covid or have been in contact with anyone that has tested positive in the last 10 days please notify you surgeon.    [place] - Preparing for Surgery Before surgery, you can play an important role.  Because skin is not sterile, your skin needs to be as free of germs as possible.  You can reduce the number of germs on your skin by washing with CHG (chlorahexidine gluconate) soap before surgery.  CHG is an antiseptic cleaner which kills germs and bonds with the skin to continue killing germs even after washing. Please DO NOT use if you have an allergy to CHG or antibacterial soaps.  If your skin becomes reddened/irritated stop using the CHG and inform your nurse when you arrive at Short Stay. Do not shave (including legs and underarms) for at least 48 hours prior to the first CHG shower.  You may shave your face/neck.  Please follow these instructions carefully:  1.  Shower with CHG Soap the night before surgery and the  morning of  surgery.  2.  If you choose to wash your hair, wash your hair first as usual with your normal  shampoo.  3.  After you shampoo, rinse your hair and body thoroughly to remove the shampoo.                             4.  Use CHG as you would any other liquid soap.  You can apply chg directly to the skin and wash.  Gently with a scrungie or clean washcloth.  5.  Apply the CHG Soap to your body ONLY FROM THE NECK DOWN.   Do   not use on face/ open                           Wound or open sores. Avoid contact with eyes, ears mouth and   genitals (private parts).                       Wash face,  Genitals (private parts) with your normal soap.             6.  Wash thoroughly, paying special attention to the area where your    surgery  will be performed.  7.  Thoroughly rinse your body with warm water from the neck down.  8.  DO NOT shower/wash with your normal soap after using and rinsing off the CHG Soap.                9.  Pat yourself dry with a clean towel.            10.  Wear clean pajamas.            11.  Place clean sheets on your bed the night of your first shower and do not  sleep with pets. Day of Surgery : Do not apply any lotions/deodorants the morning of surgery.  Please wear clean clothes to the hospital/surgery center.  FAILURE TO FOLLOW THESE INSTRUCTIONS MAY RESULT IN THE CANCELLATION OF YOUR SURGERY  PATIENT SIGNATURE_________________________________  NURSE SIGNATURE__________________________________  ________________________________________________________________________

## 2024-01-13 NOTE — Progress Notes (Addendum)
 COVID Vaccine Completed: yes  Date of COVID positive in last 90 days:  PCP - Trula Brim, MD Cardiologist - Newman Lawrence, MD LOV 07/28/23  Chest x-ray - CT 12/08/23 Epic EKG - 07/28/23 Epic brady at 53 Stress Test - 06/29/21 Epic ECHO - N/A Cardiac Cath - n/a Pacemaker/ICD device last checked:N/A Spinal Cord Stimulator:N/A  Bowel Prep - N/A  Sleep Study - N/A CPAP -   Fasting Blood Sugar - N/A Checks Blood Sugar _____ times a day  Last dose of GLP1 agonist-  N/A GLP1 instructions:  Do not take after     Last dose of SGLT-2 inhibitors-  N/A SGLT-2 instructions:  Do not take after     Blood Thinner Instructions: N/A Last dose:   Time: Aspirin Instructions: ASA 81, hold 5 days Last Dose:  Activity level:  Can go up a flight of stairs and perform activities of daily living without stopping and without symptoms of chest pain or shortness of breath.  Able to exercise without symptoms  Unable to go up a flight of stairs without symptoms of     Anesthesia review: CAD, AAA, HTN, syncope   Patient denies shortness of breath, fever, cough and chest pain at PAT appointment  Patient verbalized understanding of instructions that were given to them at the PAT appointment. Patient was also instructed that they will need to review over the PAT instructions again at home before surgery.

## 2024-01-17 ENCOUNTER — Ambulatory Visit (HOSPITAL_COMMUNITY)
Admission: RE | Admit: 2024-01-17 | Discharge: 2024-01-17 | Disposition: A | Discharge status: Home / Self Care | Attending: Urology | Admitting: Urology

## 2024-01-17 ENCOUNTER — Ambulatory Visit (HOSPITAL_COMMUNITY)

## 2024-01-17 ENCOUNTER — Other Ambulatory Visit (HOSPITAL_COMMUNITY): Payer: Self-pay

## 2024-01-17 ENCOUNTER — Encounter (HOSPITAL_COMMUNITY): Payer: Self-pay | Admitting: Urology

## 2024-01-17 ENCOUNTER — Encounter (HOSPITAL_COMMUNITY)
Admission: RE | Disposition: A | Discharge status: Home / Self Care | Payer: Self-pay | Source: Home / Self Care | Attending: Urology

## 2024-01-17 ENCOUNTER — Ambulatory Visit (HOSPITAL_COMMUNITY): Payer: Self-pay | Admitting: Physician Assistant

## 2024-01-17 ENCOUNTER — Ambulatory Visit (HOSPITAL_BASED_OUTPATIENT_CLINIC_OR_DEPARTMENT_OTHER): Payer: Self-pay | Admitting: Anesthesiology

## 2024-01-17 DIAGNOSIS — N201 Calculus of ureter: Secondary | ICD-10-CM

## 2024-01-17 DIAGNOSIS — G8929 Other chronic pain: Secondary | ICD-10-CM | POA: Diagnosis not present

## 2024-01-17 DIAGNOSIS — N132 Hydronephrosis with renal and ureteral calculous obstruction: Secondary | ICD-10-CM | POA: Insufficient documentation

## 2024-01-17 DIAGNOSIS — Z87891 Personal history of nicotine dependence: Secondary | ICD-10-CM | POA: Diagnosis not present

## 2024-01-17 DIAGNOSIS — I2510 Atherosclerotic heart disease of native coronary artery without angina pectoris: Secondary | ICD-10-CM

## 2024-01-17 DIAGNOSIS — I739 Peripheral vascular disease, unspecified: Secondary | ICD-10-CM | POA: Diagnosis not present

## 2024-01-17 DIAGNOSIS — I714 Abdominal aortic aneurysm, without rupture, unspecified: Secondary | ICD-10-CM | POA: Diagnosis not present

## 2024-01-17 DIAGNOSIS — I10 Essential (primary) hypertension: Secondary | ICD-10-CM

## 2024-01-17 SURGERY — CYSTOSCOPY/URETEROSCOPY/HOLMIUM LASER/STENT PLACEMENT
Anesthesia: General | Laterality: Right

## 2024-01-17 MED ORDER — ORAL CARE MOUTH RINSE: 15.0000 mL | Freq: Once | OROMUCOSAL | Status: AC

## 2024-01-17 MED ORDER — CHLORHEXIDINE GLUCONATE 0.12 % MT SOLN
15.0000 mL | Freq: Once | OROMUCOSAL | Status: AC
  Administered 2024-01-17: 15 mL via OROMUCOSAL

## 2024-01-17 MED ORDER — LIDOCAINE 2% (20 MG/ML) 5 ML SYRINGE
INTRAMUSCULAR | Status: DC | PRN
  Administered 2024-01-17: 80 mg via INTRAVENOUS

## 2024-01-17 MED ORDER — ONDANSETRON HCL 4 MG/2ML IJ SOLN
INTRAMUSCULAR | Status: DC | PRN
  Administered 2024-01-17: 4 mg via INTRAVENOUS

## 2024-01-17 MED ORDER — SODIUM CHLORIDE 0.9 % IR SOLN
Status: DC | PRN
  Administered 2024-01-17: 3000 mL via INTRAVESICAL

## 2024-01-17 MED ORDER — LACTATED RINGERS IV SOLN: INTRAVENOUS | Status: DC | PRN

## 2024-01-17 MED ORDER — DEXAMETHASONE SODIUM PHOSPHATE 10 MG/ML IJ SOLN
INTRAMUSCULAR | Status: DC | PRN
  Administered 2024-01-17: 5 mg via INTRAVENOUS

## 2024-01-17 MED ORDER — FENTANYL CITRATE (PF) 100 MCG/2ML IJ SOLN
INTRAMUSCULAR | Status: DC | PRN
  Administered 2024-01-17 (×2): 25 ug via INTRAVENOUS

## 2024-01-17 MED ORDER — HYOSCYAMINE SULFATE 0.125 MG SL SUBL
0.1250 mg | SUBLINGUAL_TABLET | Freq: Four times a day (QID) | SUBLINGUAL | 0 refills | Status: AC | PRN
  Filled 2024-01-17 (×2): qty 30, 8d supply, fill #0

## 2024-01-17 MED ORDER — OXYCODONE HCL 5 MG/5ML PO SOLN: 5.0000 mg | Freq: Once | ORAL | Status: DC | PRN

## 2024-01-17 MED ORDER — CIPROFLOXACIN IN D5W 400 MG/200ML IV SOLN
400.0000 mg | INTRAVENOUS | Status: AC
  Administered 2024-01-17: 400 mg via INTRAVENOUS
  Filled 2024-01-17: qty 200

## 2024-01-17 MED ORDER — EPHEDRINE SULFATE-NACL 50-0.9 MG/10ML-% IV SOSY
PREFILLED_SYRINGE | INTRAVENOUS | Status: DC | PRN
  Administered 2024-01-17 (×3): 5 mg via INTRAVENOUS

## 2024-01-17 MED ORDER — PROPOFOL 10 MG/ML IV BOLUS
INTRAVENOUS | Status: DC | PRN
  Administered 2024-01-17: 200 mg via INTRAVENOUS

## 2024-01-17 MED ORDER — DEXAMETHASONE SODIUM PHOSPHATE 10 MG/ML IJ SOLN
INTRAMUSCULAR | Status: AC
  Filled 2024-01-17: qty 3

## 2024-01-17 MED ORDER — ACETAMINOPHEN 500 MG PO TABS
1000.0000 mg | ORAL_TABLET | Freq: Once | ORAL | Status: AC
  Administered 2024-01-17: 1000 mg via ORAL
  Filled 2024-01-17: qty 2

## 2024-01-17 MED ORDER — FENTANYL CITRATE PF 50 MCG/ML IJ SOSY: 25.0000 ug | PREFILLED_SYRINGE | INTRAMUSCULAR | Status: DC | PRN

## 2024-01-17 MED ORDER — TRAMADOL HCL 50 MG PO TABS
50.0000 mg | ORAL_TABLET | Freq: Four times a day (QID) | ORAL | 0 refills | Status: DC | PRN
  Filled 2024-01-17 (×2): qty 20, 5d supply, fill #0

## 2024-01-17 MED ORDER — AMISULPRIDE (ANTIEMETIC) 5 MG/2ML IV SOLN: 10.0000 mg | Freq: Once | INTRAVENOUS | Status: DC | PRN

## 2024-01-17 MED ORDER — ONDANSETRON HCL 4 MG/2ML IJ SOLN
INTRAMUSCULAR | Status: AC
  Filled 2024-01-17: qty 4

## 2024-01-17 MED ORDER — FENTANYL CITRATE (PF) 100 MCG/2ML IJ SOLN
INTRAMUSCULAR | Status: AC
  Filled 2024-01-17: qty 2

## 2024-01-17 MED ORDER — LACTATED RINGERS IV SOLN: INTRAVENOUS | Status: DC

## 2024-01-17 MED ORDER — CIPROFLOXACIN HCL 500 MG PO TABS
500.0000 mg | ORAL_TABLET | Freq: Once | ORAL | 0 refills | Status: AC
  Filled 2024-01-17 (×2): qty 1, 1d supply, fill #0

## 2024-01-17 MED ORDER — IOHEXOL 300 MG/ML  SOLN
INTRAMUSCULAR | Status: DC | PRN
  Administered 2024-01-17: 11 mL

## 2024-01-17 MED ORDER — OXYCODONE HCL 5 MG PO TABS: 5.0000 mg | ORAL_TABLET | Freq: Once | ORAL | Status: DC | PRN

## 2024-01-17 SURGICAL SUPPLY — 19 items
BAG URO CATCHER STRL LF (MISCELLANEOUS) ×1 IMPLANT
BASKET LASER NITINOL 1.9FR (BASKET) IMPLANT
BASKET ZERO TIP NITINOL 2.4FR (BASKET) IMPLANT
CATH URETL OPEN 5X70 (CATHETERS) ×1 IMPLANT
CLOTH BEACON ORANGE TIMEOUT ST (SAFETY) ×1 IMPLANT
DRSG TEGADERM 2-3/8X2-3/4 SM (GAUZE/BANDAGES/DRESSINGS) IMPLANT
FIBER LASER MOSES 200 DFL (Laser) IMPLANT
GLOVE BIO SURGEON STRL SZ 6.5 (GLOVE) ×1 IMPLANT
GOWN STRL REUS W/ TWL LRG LVL3 (GOWN DISPOSABLE) ×1 IMPLANT
GUIDEWIRE STR DUAL SENSOR (WIRE) ×1 IMPLANT
KIT TURNOVER KIT A (KITS) ×1 IMPLANT
MANIFOLD NEPTUNE II (INSTRUMENTS) ×1 IMPLANT
PACK CYSTO (CUSTOM PROCEDURE TRAY) ×1 IMPLANT
SHEATH NAVIGATOR HD 11/13X28 (SHEATH) IMPLANT
SHEATH NAVIGATOR HD 11/13X36 (SHEATH) IMPLANT
STENT URET 6FRX26 CONTOUR (STENTS) IMPLANT
TRACTIP FLEXIVA PULS ID 200XHI (Laser) IMPLANT
TUBING CONNECTING 10 (TUBING) ×1 IMPLANT
TUBING UROLOGY SET (TUBING) ×1 IMPLANT

## 2024-01-17 NOTE — Interval H&P Note (Signed)
 History and Physical Interval Note:  01/17/2024 1:10 PM  William Warner  has presented today for surgery, with the diagnosis of RIGHT URETERAL CALCULUS.  The various methods of treatment have been discussed with the patient and family. After consideration of risks, benefits and other options for treatment, the patient has consented to  Procedure(s) with comments: CYSTOSCOPY/URETEROSCOPY/HOLMIUM LASER/STENT PLACEMENT (Right) - CYSTOSCOPY/RIGHT URETEROSCOPY/HOLMIUM LASER/STENT PLACEMENT/RETROGRADE PYELOGRAM as a surgical intervention.  The patient's history has been reviewed, patient examined, no change in status, stable for surgery.  I have reviewed the patient's chart and labs.  Questions were answered to the patient's satisfaction.     Jalani Cullifer D Heavyn Yearsley

## 2024-01-17 NOTE — Transfer of Care (Signed)
 Immediate Anesthesia Transfer of Care Note  Patient: William Warner  Procedure(s) Performed: CYSTOSCOPY/URETEROSCOPY/HOLMIUM LASER/STENT PLACEMENT (Right)  Patient Location: PACU  Anesthesia Type:General  Level of Consciousness: awake and alert   Airway & Oxygen Therapy: Patient Spontanous Breathing and Patient connected to face mask oxygen  Post-op Assessment: Report given to RN and Post -op Vital signs reviewed and stable  Post vital signs: Reviewed and stable  Last Vitals:  Vitals Value Taken Time  BP 127/62 01/17/24 14:46  Temp    Pulse 70 01/17/24 14:48  Resp 13 01/17/24 14:48  SpO2 99 % 01/17/24 14:48  Vitals shown include unfiled device data.  Last Pain:  Vitals:   01/17/24 1137  TempSrc:   PainSc: 0-No pain         Complications: No notable events documented.

## 2024-01-17 NOTE — Anesthesia Procedure Notes (Signed)
 Procedure Name: Intubation Date/Time: 01/17/2024 1:45 PM  Performed by: Markese Bloxham, CRNAPre-anesthesia Checklist: Patient identified, Emergency Drugs available, Suction available and Patient being monitored Patient Re-evaluated:Patient Re-evaluated prior to induction Oxygen Delivery Method: Circle system utilized Preoxygenation: Pre-oxygenation with 100% oxygen Induction Type: IV induction Ventilation: Mask ventilation without difficulty LMA: LMA with gastric port inserted LMA Size: 4.0 Tube type: Oral Number of attempts: 1 Airway Equipment and Method: Stylet and Oral airway Placement Confirmation: positive ETCO2 and breath sounds checked- equal and bilateral Tube secured with: Tape Dental Injury: Teeth and Oropharynx as per pre-operative assessment

## 2024-01-17 NOTE — Discharge Instructions (Addendum)
 DISCHARGE INSTRUCTIONS FOR KIDNEY STONE/URETERAL STENT   MEDICATIONS:  1. Resume all your other meds from home  2. AZO over the counter can help with the burning/stinging when you urinate. 3. Tramadol  is for moderate/severe pain, otherwise taking up to 1000 mg every 6 hours of plainTylenol will help treat your pain.   4. Take cipro  one hour prior to removal of your stent.  5.  Tamsulosin  and hyoscyamine  can help with stent discomfort and bladder cramping from the stent  ACTIVITY:  1. No strenuous activity x 1week  2. No driving while on narcotic pain medications  3. Drink plenty of water  4. Continue to walk at home - you can still get blood clots when you are at home, so keep active, but don't over do it.  5. May return to work/school tomorrow or when you feel ready   BATHING:  1. You can shower and we recommend daily showers    SIGNS/SYMPTOMS TO CALL:  Please call us  if you have a fever greater than 101.5, uncontrolled nausea/vomiting, uncontrolled pain, dizziness, unable to urinate, bloody urine, chest pain, shortness of breath, leg swelling, leg pain, redness around wound, drainage from wound, or any other concerns or questions.   You can reach us  at [phone].   FOLLOW-UP:  1. You will be contacted with your next surgery date in 2-3 weeks

## 2024-01-17 NOTE — Op Note (Signed)
 Preoperative diagnosis: right ureteral calculus  Postoperative diagnosis: right ureteral calculus  Procedure:  Cystoscopy right ureteroscopy, laser lithotripsy, basket stone extraction right 77F x 26cm ureteral stent placement -no string right retrograde pyelography with interpretation  Surgeon: Valli Shank, MD  Anesthesia: General  Complications: None  Intraoperative findings:  Normal urethra Bilateral lobe hypertrophy prostatic urethra with median lobe, friable and bled easily Bilateral orthotropic ureteral orifices right retrograde pyelography demonstrated a filling defect within the right ureter consistent with the patient's known calculus with proximal hydronephrosis and tortuosity of the ureter Bladder mucosa normal without masses   EBL: Minimal  Specimens: right ureteral calculus  Disposition of specimens: [facility] for stone analysis  Indication: William Warner is a 78 y.o.   patient with a 9mm right ureteral stone and associated right symptoms. After reviewing the management options for treatment, the patient elected to proceed with the above surgical procedure(s). We have discussed the potential benefits and risks of the procedure, side effects of the proposed treatment, the likelihood of the patient achieving the goals of the procedure, and any potential problems that might occur during the procedure or recuperation. Informed consent has been obtained.   Description of procedure:  The patient was taken to the operating room and general anesthesia was induced.  The patient was placed in the dorsal lithotomy position, prepped and draped in the usual sterile fashion, and preoperative antibiotics were administered. A preoperative time-out was performed.   Cystourethroscopy was performed.  The patient's urethra was examined and demonstrated bilobar prostatic hypertrophy. The bladder was then systematically examined in its entirety. There was no  evidence for any bladder tumors, stones, or other mucosal pathology.    Attention then turned to the right ureteral orifice and a ureteral catheter was used to intubate the ureteral orifice.  Omnipaque  contrast was injected through the ureteral catheter and a retrograde pyelogram was performed with findings as dictated above.  A 0.38 sensor guidewire was then advanced up the right ureter into the renal pelvis under fluoroscopic guidance. The 4.5Fr semirigid ureteroscope was then advanced into the ureter next to the guidewire and the calculus was identified.The stone was then fragmented with the 242 micron holmium laser fiber.  The ureter was extremely tortuous at this location and attempts at advancing the ureteroscope more proximally was met with resistance.  A second wire was then advanced through the ureteroscope and the ureteroscope was removed.  A ureteral access sheath was then placed over the second wire and advanced to the distal ureter with fluoroscopic guidance.  The inner sheath and wire were removed.  Flexible ureteroscopy took place.  Again the ureter was noted to be tortuous and the stones were getting hung up at this location.  There is edematous ureteral mucosa at that point.  After multiple attempts to access the other stone fragments were unsuccessful decision was made to leave a stent.  The ureteroscope was then removed in unison with the access sheath take care examine the ureter on the way out.  The wire was then backloaded through the cystoscope and a ureteral stent was advance over the wire using Seldinger technique.  The stent was positioned appropriately under fluoroscopic and cystoscopic guidance.  The wire was then removed with an adequate stent curl noted in the renal pelvis as well as in the bladder.  The bladder was then emptied and the procedure ended.  The patient appeared to tolerate the procedure well and without complications.  The patient was able to be  awakened and  transferred to the recovery unit in satisfactory condition.   Disposition: The patient will return in 2 to 3 weeks for a second stage ureteroscopy for removal of stone fragments.

## 2024-01-17 NOTE — H&P (View-Only) (Signed)
 CC/HPI: cc: Microscopic hematuria   12/02/2021: 78 year old man referred for microscopic hematuria. Urinalysis showed greater than 20 RBCs per high-powered field. Patient thinks that he actually passed his kidney stone around that time. He was having chronic back pain and then developed slightly different symptoms which suddenly resolved after he felt something pass. He had a split stream for a short period time which then resolved after passing what he thinks was a stone. Patient has passed many stones in the past. He denies any gross hematuria. He does have a significant smoking history and gets surveillance chest CTs. urinalysis today is normal.   01/15/22: 78 year old man initially referred for microscopic hematuria however feels like he was passing a kidney stone during that time here for follow-up after CT scan. CT of the abdomen pelvis showed bilateral nonobstructing stones the largest measuring 5 mm. No evidence of ureteral obstruction. No large bladder mass seen on CT however we did discuss the utilization of CT scan detecting bladder cancer. Patient has not seen any further episodes of hematuria or not passed any other stones. Urinalysis today in his last visit did not show any microscopic hematuria.   02/10/2023: Patient with above-noted history. Returns today for evaluation of gross hematuria. Symptoms began on Monday of this week. He intermittently passed blood in his urine without clot or stone material for about 24 hours before it cleared. Symptoms not associated with unilateral lower back or flank pain/discomfort suggestive of obstructive uropathy. not associated with any pain or burning with urination. He is on tamsulosin  from primary care. Force of stream and daytime frequency remain grossly unchanged. He continues to endorse numerous passed stones over the course of the past year. In April he was in the emergency department with severe right lower quadrant abdominal pain. CT imaging revealed  diverticulitis as well as a 3 to 4 mm obstructing right proximal ureteral calculus. He states he eventually did pass that stone. Again he states the symptoms are not consistent with prior stone events.   04/13/2023: 78 year old man initially seen for microscopic hematuria also with urolithiasis here for follow-up. He last passed a stone in April 2024. He developed gross hematuria in September 2024 and at the time Ubaldo recommended CT urogram followed by cystoscopy. Patient declined a CT scan but was okay with scheduling a cystoscopy. His urinalysis today continues to show 10-20 RBCs per high-powered field.   05/27/2023: 78 year old man with a history of urolithiasis and recent gross hematuria evaluation here for CT scan results.   07/27/2023: 78 year old man with a history of urolithiasis and gross hematuria here for follow-up. He is undergone negative hematuria evaluation and now returns for 24-hour urine results. Overall 24-hour urine shows low urine volume and low urine citrate. He has been having persistent right back pain with walking and standing. Pain resolves with sitting and laying down.   12/28/23: 78 year old male with a history of urolithiasis comes back in with a 9 mm right proximal ureteral calculus and hydronephrosis. He had a chest CT for lung cancer screening and was found to have right hydronephrosis. Subsequent CT of the abdomen pelvis shows a 9 mm stone in the proximal ureter causing obstruction. Patient is not having significant pain but does have some right upper quadrant discomfort. No gross hematuria. Urinalysis looks normal today.     ALLERGIES: Augmentin - Diarrhea    MEDICATIONS: Tamsulosin  HCl 0.4 MG Capsule  Aspirin EC Low Dose 81 MG Tablet Delayed Release  Atenolol  25 MG Tablet  Centrum Men 50  Plus Minis  clonazePAM  2 MG Tablet  EpiPen 2-Pak 0.3 MG/0.3ML Solution Auto-injector  Gemfibrozil  600 MG Tablet  Rosuvastatin  Calcium  20 MG Tablet  Vitamin D3     GU PSH:  Cystoscopy - 04/13/2023 Locm 300-399Mg /Ml Iodine,1Ml - 05/12/2023, 2023     NON-GU PSH: Diagnostic Colonoscopy - 2016 Knee Arthroscopy/surgery Remove Tonsils - 2016 Visit Complexity (formerly GPC1X) - 07/27/2023, 05/27/2023, 02/10/2023     GU PMH: Low back pain - 07/27/2023 Microscopic hematuria - 07/27/2023, - 01/15/2022, - 2023, - 2023 Renal calculus - 07/27/2023, - 05/27/2023 (Stable), - 04/13/2023, - 02/10/2023, - 01/15/2022 Gross hematuria - 05/27/2023, - 05/12/2023, - 04/13/2023, - 02/10/2023 Abdominal Pain Unspec, Right - 04/13/2023, (Acute), Right, Insurance would not approve CT abd/pelvis today. Did proceed with KUB and instructed if pain persist or worsens he will need to go ER over weekend or see PCP next week for evaluation and possible CT., - 2018 History of urolithiasis - 2023, History of kidney stones, - 2016 Ureteral calculus, Right ureteral calculus - 2016      PMH Notes: Skin Cancer  Kidney stones.   NON-GU PMH: Encounter for general adult medical examination without abnormal findings, Encounter for preventive health examination - 2016 Gastritis, unspecified, with bleeding, Hemorrhagic gastritis - 2016 Personal history of other diseases of the circulatory system, History of peripheral vascular disease - 2016, History of hypertension, - 2016 Personal history of other diseases of the digestive system, History of esophageal reflux - 2016 Personal history of other diseases of the musculoskeletal system and connective tissue, History of osteoarthritis - 2016, History of low back pain, - 2016 Personal history of other endocrine, nutritional and metabolic disease, History of hypothyroidism - 2016, History of hyperlipidemia, - 2016 Personal history of other specified conditions, History of insomnia - 2016 Diverticulosis Hypercholesterolemia Hypertension    FAMILY HISTORY: 1 Daughter - Runs in Family 2 sons - Runs in Family Death of family member - Runs In Family Kidney Stones -  Mother Lung Cancer - Runs In Family malignant neoplasm of breast - Runs In Family nephrolithiasis - Runs In Family Prostate Cancer - Brother Strokes - Runs In Family   SOCIAL HISTORY: Marital Status: Married Preferred Language: English; Ethnicity: Not Hispanic Or Latino; Race: White Current Smoking Status: Patient does not smoke anymore. Has not smoked since 05/28/2016. Smoked for 50 years.   Tobacco Use Assessment Completed: Used Tobacco in last 30 days? Does not use smokeless tobacco. Does not drink anymore.  Does not use drugs. Drinks 1 caffeinated drink per day.    REVIEW OF SYSTEMS:    GU Review Male:   Patient denies frequent urination, hard to postpone urination, burning/ pain with urination, get up at night to urinate, leakage of urine, stream starts and stops, trouble starting your stream, have to strain to urinate , erection problems, and penile pain.  Gastrointestinal (Upper):   Patient denies nausea, vomiting, and indigestion/ heartburn.  Gastrointestinal (Lower):   Patient denies diarrhea and constipation.  Constitutional:   Patient denies fever, night sweats, weight loss, and fatigue.  Skin:   Patient denies skin rash/ lesion and itching.  Eyes:   Patient denies blurred vision and double vision.  Ears/ Nose/ Throat:   Patient denies sore throat and sinus problems.  Hematologic/Lymphatic:   Patient denies swollen glands and easy bruising.  Cardiovascular:   Patient denies leg swelling and chest pains.  Respiratory:   Patient denies cough and shortness of breath.  Endocrine:   Patient denies excessive thirst.  Musculoskeletal:  Patient denies back pain and joint pain.  Neurological:   Patient denies headaches and dizziness.  Psychologic:   Patient denies depression and anxiety.   VITAL SIGNS:      12/28/2023 12:29 PM  BP 115/62 mmHg  Pulse 51 /min   MULTI-SYSTEM PHYSICAL EXAMINATION:    Constitutional: Well-nourished. No physical deformities. Normally developed.  Good grooming.  Neck: Neck symmetrical, not swollen. Normal tracheal position.  Respiratory: No labored breathing, no use of accessory muscles.   Skin: No paleness, no jaundice, no cyanosis. No lesion, no ulcer, no rash.  Neurologic / Psychiatric: Oriented to time, oriented to place, oriented to person. No depression, no anxiety, no agitation.  Eyes: Normal conjunctivae. Normal eyelids.  Ears, Nose, Mouth, and Throat: Left ear no scars, no lesions, no masses. Right ear no scars, no lesions, no masses. Nose no scars, no lesions, no masses. Normal hearing. Normal lips.  Musculoskeletal: Normal gait and station of head and neck.     Complexity of Data:  Records Review:   Previous Patient Records, POC Tool  Urine Test Review:   Urinalysis  X-Ray Review: KUB: Reviewed Films. Discussed With Patient. Unable to clearly identify right ureteral calculus on KUB or CT scout C.T. Abdomen/Pelvis: Reviewed Films. Reviewed Report. Discussed With Patient. IMPRESSION:  1. Moderate right-sided hydroureteronephrosis due to an obstructive  calculus in the proximal right ureter, measuring 4.5 x 6.5 x 9.3 cm.  Nonobstructive nephrolithiasis also noted in both kidneys.  2. Total colonic diverticulosis. No changes of acute diverticulitis.   Aortic aneurysm NOS (ICD10-I71.9). Aortic Atherosclerosis  (ICD10-I70.0).    Electronically Signed  By: Rogelia Myers M.D.  On: 12/22/2023 16:49    PROCEDURES:         KUB - [id number]  A single view of the abdomen is obtained.      . Patient confirmed No Neulasta OnPro Device.           Urinalysis Dipstick Dipstick Cont'd  Color: Yellow Bilirubin: Neg mg/dL  Appearance: Clear Ketones: Neg mg/dL  Specific Gravity: 8.979 Blood: Neg ery/uL  pH: 6.0 Protein: Trace mg/dL  Glucose: Neg mg/dL Urobilinogen: 0.2 mg/dL    Nitrites: Neg    Leukocyte Esterase: Neg leu/uL    ASSESSMENT:      ICD-10 Details  1 GU:   Renal calculus - N20.0 Chronic, Stable  2   Ureteral  calculus - N20.1 Acute, Uncomplicated   PLAN:           Orders X-Rays: KUB          Schedule         Document Letter(s):  Created for Patient: Clinical Summary         Notes:   1. Urolithiasis:  - Patient with 9 mm right proximal ureteral calculus that I am unable to clearly visualize on KUB  -We discussed medical expulsive therapy versus right sided ureteroscopy with laser lithotripsy and stent placement  -Risks and benefits discussed including but not limited to pain, bleeding, stent discomfort, urethral stricture, damage to surrounding structures, need for additional treatment  -We discussed the risk of waiting is possible impaction of stone and impact on kidney function   Schedule next available surgery

## 2024-01-17 NOTE — H&P (Signed)
 CC/HPI: cc: Microscopic hematuria   12/02/2021: 78 year old man referred for microscopic hematuria. Urinalysis showed greater than 20 RBCs per high-powered field. Patient thinks that he actually passed his kidney stone around that time. He was having chronic back pain and then developed slightly different symptoms which suddenly resolved after he felt something pass. He had a split stream for a short period time which then resolved after passing what he thinks was a stone. Patient has passed many stones in the past. He denies any gross hematuria. He does have a significant smoking history and gets surveillance chest CTs. urinalysis today is normal.   01/15/22: 78 year old man initially referred for microscopic hematuria however feels like he was passing a kidney stone during that time here for follow-up after CT scan. CT of the abdomen pelvis showed bilateral nonobstructing stones the largest measuring 5 mm. No evidence of ureteral obstruction. No large bladder mass seen on CT however we did discuss the utilization of CT scan detecting bladder cancer. Patient has not seen any further episodes of hematuria or not passed any other stones. Urinalysis today in his last visit did not show any microscopic hematuria.   02/10/2023: Patient with above-noted history. Returns today for evaluation of gross hematuria. Symptoms began on Monday of this week. He intermittently passed blood in his urine without clot or stone material for about 24 hours before it cleared. Symptoms not associated with unilateral lower back or flank pain/discomfort suggestive of obstructive uropathy. not associated with any pain or burning with urination. He is on tamsulosin  from primary care. Force of stream and daytime frequency remain grossly unchanged. He continues to endorse numerous passed stones over the course of the past year. In April he was in the emergency department with severe right lower quadrant abdominal pain. CT imaging revealed  diverticulitis as well as a 3 to 4 mm obstructing right proximal ureteral calculus. He states he eventually did pass that stone. Again he states the symptoms are not consistent with prior stone events.   04/13/2023: 78 year old man initially seen for microscopic hematuria also with urolithiasis here for follow-up. He last passed a stone in April 2024. He developed gross hematuria in September 2024 and at the time Ubaldo recommended CT urogram followed by cystoscopy. Patient declined a CT scan but was okay with scheduling a cystoscopy. His urinalysis today continues to show 10-20 RBCs per high-powered field.   05/27/2023: 78 year old man with a history of urolithiasis and recent gross hematuria evaluation here for CT scan results.   07/27/2023: 78 year old man with a history of urolithiasis and gross hematuria here for follow-up. He is undergone negative hematuria evaluation and now returns for 24-hour urine results. Overall 24-hour urine shows low urine volume and low urine citrate. He has been having persistent right back pain with walking and standing. Pain resolves with sitting and laying down.   12/28/23: 78 year old male with a history of urolithiasis comes back in with a 9 mm right proximal ureteral calculus and hydronephrosis. He had a chest CT for lung cancer screening and was found to have right hydronephrosis. Subsequent CT of the abdomen pelvis shows a 9 mm stone in the proximal ureter causing obstruction. Patient is not having significant pain but does have some right upper quadrant discomfort. No gross hematuria. Urinalysis looks normal today.     ALLERGIES: Augmentin - Diarrhea    MEDICATIONS: Tamsulosin  HCl 0.4 MG Capsule  Aspirin EC Low Dose 81 MG Tablet Delayed Release  Atenolol  25 MG Tablet  Centrum Men 50  Plus Minis  clonazePAM  2 MG Tablet  EpiPen 2-Pak 0.3 MG/0.3ML Solution Auto-injector  Gemfibrozil  600 MG Tablet  Rosuvastatin  Calcium  20 MG Tablet  Vitamin D3     GU PSH:  Cystoscopy - 04/13/2023 Locm 300-399Mg /Ml Iodine,1Ml - 05/12/2023, 2023     NON-GU PSH: Diagnostic Colonoscopy - 2016 Knee Arthroscopy/surgery Remove Tonsils - 2016 Visit Complexity (formerly GPC1X) - 07/27/2023, 05/27/2023, 02/10/2023     GU PMH: Low back pain - 07/27/2023 Microscopic hematuria - 07/27/2023, - 01/15/2022, - 2023, - 2023 Renal calculus - 07/27/2023, - 05/27/2023 (Stable), - 04/13/2023, - 02/10/2023, - 01/15/2022 Gross hematuria - 05/27/2023, - 05/12/2023, - 04/13/2023, - 02/10/2023 Abdominal Pain Unspec, Right - 04/13/2023, (Acute), Right, Insurance would not approve CT abd/pelvis today. Did proceed with KUB and instructed if pain persist or worsens he will need to go ER over weekend or see PCP next week for evaluation and possible CT., - 2018 History of urolithiasis - 2023, History of kidney stones, - 2016 Ureteral calculus, Right ureteral calculus - 2016      PMH Notes: Skin Cancer  Kidney stones.   NON-GU PMH: Encounter for general adult medical examination without abnormal findings, Encounter for preventive health examination - 2016 Gastritis, unspecified, with bleeding, Hemorrhagic gastritis - 2016 Personal history of other diseases of the circulatory system, History of peripheral vascular disease - 2016, History of hypertension, - 2016 Personal history of other diseases of the digestive system, History of esophageal reflux - 2016 Personal history of other diseases of the musculoskeletal system and connective tissue, History of osteoarthritis - 2016, History of low back pain, - 2016 Personal history of other endocrine, nutritional and metabolic disease, History of hypothyroidism - 2016, History of hyperlipidemia, - 2016 Personal history of other specified conditions, History of insomnia - 2016 Diverticulosis Hypercholesterolemia Hypertension    FAMILY HISTORY: 1 Daughter - Runs in Family 2 sons - Runs in Family Death of family member - Runs In Family Kidney Stones -  Mother Lung Cancer - Runs In Family malignant neoplasm of breast - Runs In Family nephrolithiasis - Runs In Family Prostate Cancer - Brother Strokes - Runs In Family   SOCIAL HISTORY: Marital Status: Married Preferred Language: English; Ethnicity: Not Hispanic Or Latino; Race: White Current Smoking Status: Patient does not smoke anymore. Has not smoked since 05/28/2016. Smoked for 50 years.   Tobacco Use Assessment Completed: Used Tobacco in last 30 days? Does not use smokeless tobacco. Does not drink anymore.  Does not use drugs. Drinks 1 caffeinated drink per day.    REVIEW OF SYSTEMS:    GU Review Male:   Patient denies frequent urination, hard to postpone urination, burning/ pain with urination, get up at night to urinate, leakage of urine, stream starts and stops, trouble starting your stream, have to strain to urinate , erection problems, and penile pain.  Gastrointestinal (Upper):   Patient denies nausea, vomiting, and indigestion/ heartburn.  Gastrointestinal (Lower):   Patient denies diarrhea and constipation.  Constitutional:   Patient denies fever, night sweats, weight loss, and fatigue.  Skin:   Patient denies skin rash/ lesion and itching.  Eyes:   Patient denies blurred vision and double vision.  Ears/ Nose/ Throat:   Patient denies sore throat and sinus problems.  Hematologic/Lymphatic:   Patient denies swollen glands and easy bruising.  Cardiovascular:   Patient denies leg swelling and chest pains.  Respiratory:   Patient denies cough and shortness of breath.  Endocrine:   Patient denies excessive thirst.  Musculoskeletal:  Patient denies back pain and joint pain.  Neurological:   Patient denies headaches and dizziness.  Psychologic:   Patient denies depression and anxiety.   VITAL SIGNS:      12/28/2023 12:29 PM  BP 115/62 mmHg  Pulse 51 /min   MULTI-SYSTEM PHYSICAL EXAMINATION:    Constitutional: Well-nourished. No physical deformities. Normally developed.  Good grooming.  Neck: Neck symmetrical, not swollen. Normal tracheal position.  Respiratory: No labored breathing, no use of accessory muscles.   Skin: No paleness, no jaundice, no cyanosis. No lesion, no ulcer, no rash.  Neurologic / Psychiatric: Oriented to time, oriented to place, oriented to person. No depression, no anxiety, no agitation.  Eyes: Normal conjunctivae. Normal eyelids.  Ears, Nose, Mouth, and Throat: Left ear no scars, no lesions, no masses. Right ear no scars, no lesions, no masses. Nose no scars, no lesions, no masses. Normal hearing. Normal lips.  Musculoskeletal: Normal gait and station of head and neck.     Complexity of Data:  Records Review:   Previous Patient Records, POC Tool  Urine Test Review:   Urinalysis  X-Ray Review: KUB: Reviewed Films. Discussed With Patient. Unable to clearly identify right ureteral calculus on KUB or CT scout C.T. Abdomen/Pelvis: Reviewed Films. Reviewed Report. Discussed With Patient. IMPRESSION:  1. Moderate right-sided hydroureteronephrosis due to an obstructive  calculus in the proximal right ureter, measuring 4.5 x 6.5 x 9.3 cm.  Nonobstructive nephrolithiasis also noted in both kidneys.  2. Total colonic diverticulosis. No changes of acute diverticulitis.   Aortic aneurysm NOS (ICD10-I71.9). Aortic Atherosclerosis  (ICD10-I70.0).    Electronically Signed  By: Rogelia Myers M.D.  On: 12/22/2023 16:49    PROCEDURES:         KUB - [id number]  A single view of the abdomen is obtained.      . Patient confirmed No Neulasta OnPro Device.           Urinalysis Dipstick Dipstick Cont'd  Color: Yellow Bilirubin: Neg mg/dL  Appearance: Clear Ketones: Neg mg/dL  Specific Gravity: 8.979 Blood: Neg ery/uL  pH: 6.0 Protein: Trace mg/dL  Glucose: Neg mg/dL Urobilinogen: 0.2 mg/dL    Nitrites: Neg    Leukocyte Esterase: Neg leu/uL    ASSESSMENT:      ICD-10 Details  1 GU:   Renal calculus - N20.0 Chronic, Stable  2   Ureteral  calculus - N20.1 Acute, Uncomplicated   PLAN:           Orders X-Rays: KUB          Schedule         Document Letter(s):  Created for Patient: Clinical Summary         Notes:   1. Urolithiasis:  - Patient with 9 mm right proximal ureteral calculus that I am unable to clearly visualize on KUB  -We discussed medical expulsive therapy versus right sided ureteroscopy with laser lithotripsy and stent placement  -Risks and benefits discussed including but not limited to pain, bleeding, stent discomfort, urethral stricture, damage to surrounding structures, need for additional treatment  -We discussed the risk of waiting is possible impaction of stone and impact on kidney function   Schedule next available surgery

## 2024-01-17 NOTE — Anesthesia Preprocedure Evaluation (Addendum)
 Anesthesia Evaluation  Patient identified by MRN, date of birth, ID band Patient awake    Reviewed: Allergy & Precautions, NPO status , Patient's Chart, lab work & pertinent test results  History of Anesthesia Complications Negative for: history of anesthetic complications  Airway Mallampati: III  TM Distance: >3 FB Neck ROM: Full    Dental  (+) Dental Advisory Given   Pulmonary neg shortness of breath, neg sleep apnea, neg COPD, neg recent URI, former smoker   Pulmonary exam normal breath sounds clear to auscultation       Cardiovascular hypertension (atenolol ), Pt. on home beta blockers (-) angina + CAD and + Peripheral Vascular Disease  (-) Past MI, (-) Cardiac Stents and (-) CABG (-) dysrhythmias  Rhythm:Regular Rate:Normal  AAA - CT chest obtained for other reasons in 01/2023 showed stable TAA at 4.1 cm  Exercise/Lexican Tetrofosmin stress test 06/29/2021: Exercise nuclear stress test was performed using Bruce protocol, with injection of IV Lexiscan  due to inadequate heart rate response. Patient reached 7 METS, and 79% of age predicted maximum heart rate. Exercise capacity was fair. No chest pain reported. Dyspnea reported. Suboptimal heart rate response. Lower blood pressure ntoed post injection. Stress EKG revealed no ischemic changes. SPECT images show medium sized, severe intensity, reversible perfusion defect in mid to apical, inferolateral myocardium. Stress LVEF 68%. Intermediate risk study.  Echocardiogram 05/26/2021: Normal LV systolic function with visual EF 60-65%. Left ventricle cavity is normal in size. Normal left ventricular wall thickness. Normal global wall motion. Normal diastolic filling pattern, normal LAP. Mild pulmonic regurgitation. Trivial pericardial effusion. There is no hemodynamic significance. Compared to study 11/06/2018: Mild PR and trivial pericardial effusion are new finding; otherwise, no  significant change.      Neuro/Psych negative neurological ROS     GI/Hepatic Neg liver ROS,GERD  ,,H/o hemorrhagic gastritis   Endo/Other  negative endocrine ROS    Renal/GU Renal disease (stone)     Musculoskeletal  (+) Arthritis ,    Abdominal   Peds  Hematology negative hematology ROS (+) Lab Results      Component                Value               Date                      WBC                      5.8                 01/13/2024                HGB                      14.3                01/13/2024                HCT                      45.9                01/13/2024                MCV                      97.0  01/13/2024                PLT                      174                 01/13/2024              Anesthesia Other Findings   Reproductive/Obstetrics                              Anesthesia Physical Anesthesia Plan  ASA: 3  Anesthesia Plan: General   Post-op Pain Management: Tylenol  PO (pre-op)*   Induction: Intravenous  PONV Risk Score and Plan: 2 and Ondansetron , Dexamethasone  and Treatment may vary due to age or medical condition  Airway Management Planned: LMA  Additional Equipment:   Intra-op Plan:   Post-operative Plan: Extubation in OR  Informed Consent: I have reviewed the patients History and Physical, chart, labs and discussed the procedure including the risks, benefits and alternatives for the proposed anesthesia with the patient or authorized representative who has indicated his/her understanding and acceptance.     Dental advisory given  Plan Discussed with: CRNA and Anesthesiologist  Anesthesia Plan Comments: (Risks of general anesthesia discussed including, but not limited to, sore throat, hoarse voice, chipped/damaged teeth, injury to vocal cords, nausea and vomiting, allergic reactions, lung infection, heart attack, stroke, and death. All questions answered. )          Anesthesia Quick Evaluation

## 2024-01-18 ENCOUNTER — Encounter (HOSPITAL_COMMUNITY): Payer: Self-pay | Admitting: Urology

## 2024-01-18 DIAGNOSIS — N201 Calculus of ureter: Secondary | ICD-10-CM | POA: Diagnosis not present

## 2024-01-18 NOTE — Anesthesia Postprocedure Evaluation (Signed)
 Anesthesia Post Note  Patient: William Warner  Procedure(s) Performed: CYSTOSCOPY/URETEROSCOPY/HOLMIUM LASER/STENT PLACEMENT (Right)     Patient location during evaluation: PACU Anesthesia Type: General Level of consciousness: awake Pain management: pain level controlled Vital Signs Assessment: post-procedure vital signs reviewed and stable Respiratory status: spontaneous breathing, nonlabored ventilation and respiratory function stable Cardiovascular status: blood pressure returned to baseline and stable Postop Assessment: no apparent nausea or vomiting Anesthetic complications: no   No notable events documented.  Last Vitals:  Vitals:   01/17/24 1515 01/17/24 1530  BP: 131/62 (!) 150/69  Pulse: 67 74  Resp: 13 17  Temp: (!) 36.4 C   SpO2: 93% 95%    Last Pain:  Vitals:   01/17/24 1530  TempSrc:   PainSc: 0-No pain                 Antonio Woodhams P Rasaan Brotherton

## 2024-01-25 DIAGNOSIS — N201 Calculus of ureter: Secondary | ICD-10-CM | POA: Diagnosis not present

## 2024-01-27 ENCOUNTER — Other Ambulatory Visit: Payer: Self-pay | Admitting: Urology

## 2024-01-31 ENCOUNTER — Encounter (HOSPITAL_COMMUNITY): Payer: Self-pay | Admitting: Urology

## 2024-01-31 ENCOUNTER — Other Ambulatory Visit: Payer: Self-pay

## 2024-01-31 NOTE — Progress Notes (Addendum)
 Anesthesia Review:  PCP: Dwight  Cardiologist : patwardhan LOV 07/27/23   PPM/ ICD: Device Orders: Rep Notified:  Chest x-ray : CT chest- 12/15/23  EKG : 07/28/23  Echo : 05/27/21  Stress test: 06/30/21  Cardiac Cath :   Activity level: can do a flight of stairs without difficugtly  Sleep Study/ CPAP : none  Fasting Blood Sugar :      / Checks Blood Sugar -- times a day:    Blood Thinner/ Instructions /Last Dose: ASA / Instructions/ Last Dose :  81 mg aspirin     Same day [phone]  Last cysto- 01/17/24  Last preop on 01/13/24  01/31/24- LVMM and asked for call back.  01/31/2024 PT called back and med hx reviewed and preop instructions completed.  PT voiced understanding.

## 2024-02-01 NOTE — Progress Notes (Signed)
 Anesthesia Chart Review   Case: [id number] Date/Time: 02/07/24 1445   Procedure: CYSTOSCOPY/URETEROSCOPY/HOLMIUM LASER/STENT PLACEMENT (Right) - CYSTOSCOPY/RIGHT URETEROSCOPY/HOLMIUM LASER/STENT EXCHANGE   Anesthesia type: General   Diagnosis: Ureteral stone [N20.1]   Pre-op diagnosis: RIGHT URETERAL CALCULUS   Location: [facility] / [facility]   Surgeons: Elisabeth Valli BIRCH, MD       DISCUSSION:78 y.o. former smoker with h/o HTN, GERD, PVD, AAA, right ureteral calculus scheduled for above procedure 02/07/2024 with Dr. Valli Elisabeth.   Pt last seen by cardiology 07/28/23. Denies chest pain or shortness of breath at this visit. Recent CT chest obtained for other reasons in 01/2023 showed stable TAA at 4.1 cm. Small AAA, repeat ultrasound can be considered in 5 to 10 years. Pt reports at PAT visit he can climb a flight of stairs without difficulty, denies chest pain or shortness of breath.   VS: There were no vitals taken for this visit.  PROVIDERS: Dwight Trula SQUIBB, MD is PCP   Elmira Newman PARAS, MD  LABS: Labs reviewed: Acceptable for surgery. (all labs ordered are listed, but only abnormal results are displayed)  Labs Reviewed - No data to display   IMAGES:   EKG:   CV: Exercise/Lexican Tetrofosmin stress test 06/29/2021: Exercise nuclear stress test was performed using Bruce protocol, with injection of IV Lexiscan  due to inadequate heart rate response. Patient reached 7 METS, and 79% of age predicted maximum heart rate. Exercise capacity was fair. No chest pain reported. Dyspnea reported. Suboptimal heart rate response. Lower blood pressure ntoed post injection. Stress EKG revealed no ischemic changes. SPECT images show medium sized, severe intensity, reversible perfusion defect in mid to apical, inferolateral myocardium. Stress LVEF 68%. Intermediate risk study.  Echocardiogram 05/26/2021: Normal LV systolic function with visual EF 60-65%. Left ventricle cavity is normal  in size. Normal left ventricular wall thickness. Normal global wall motion. Normal diastolic filling pattern, normal LAP. Mild pulmonic regurgitation. Trivial pericardial effusion. There is no hemodynamic significance. Compared to study 11/06/2018: Mild PR and trivial pericardial effusion are new finding; otherwise, no significant change.   Past Medical History:  Diagnosis Date   AAA (abdominal aortic aneurysm) (HCC)    Allergy    B12 deficiency    Cancer (HCC)    skin, basal   DJD (degenerative joint disease)    DJD (degenerative joint disease)    DOE (dyspnea on exertion)    Erectile dysfunction    GERD (gastroesophageal reflux disease)    Hearing loss    Hemorrhagic gastritis    History of kidney stones    Hx of colonoscopy 2007   Hyperlipidemia    Hypertension    Insomnia    Kidney stone 08/2010   Lumbago    Other B-complex deficiencies    Peripheral vascular disease (HCC)    PVD (peripheral vascular disease) (HCC)    Renal calculi    Vitamin D deficiency     Past Surgical History:  Procedure Laterality Date   BASAL CELL CARCINOMA EXCISION  09/2019   COLONOSCOPY     CYSTOSCOPY/URETEROSCOPY/HOLMIUM LASER/STENT PLACEMENT Right 01/17/2024   Procedure: CYSTOSCOPY/URETEROSCOPY/HOLMIUM LASER/STENT PLACEMENT;  Surgeon: Elisabeth Valli BIRCH, MD;  Location: [facility];  Service: Urology;  Laterality: Right;  CYSTOSCOPY/RIGHT URETEROSCOPY/HOLMIUM LASER/STENT PLACEMENT/RETROGRADE PYELOGRAM   KNEE SURGERY     TONSILLECTOMY      MEDICATIONS: No current facility-administered medications for this encounter.    acetaminophen  (TYLENOL ) 500 MG tablet   aspirin 81 MG tablet   atenolol  (TENORMIN ) 25 MG  tablet   Cholecalciferol 50 MCG (2000 UT) TBDP   clonazePAM  (KLONOPIN ) 2 MG tablet   cyanocobalamin (VITAMIN B12) 500 MCG tablet   EPINEPHrine (EPIPEN JR) 0.15 MG/0.3ML injection   gemfibrozil  (LOPID ) 600 MG tablet   hyoscyamine  (LEVSIN  SL) 0.125 MG SL tablet   ibuprofen  (ADVIL ) 600 MG  tablet   Multiple Vitamin tablet   nitroGLYCERIN  (NITROSTAT ) 0.4 MG SL tablet   rosuvastatin  (CRESTOR ) 20 MG tablet   tamsulosin  (FLOMAX ) 0.4 MG CAPS capsule   traMADol  (ULTRAM ) 50 MG tablet   [facility] Ward, PA-C [facility] Pre-Surgical Testing [phone]

## 2024-02-07 ENCOUNTER — Ambulatory Visit (HOSPITAL_COMMUNITY)

## 2024-02-07 ENCOUNTER — Encounter (HOSPITAL_COMMUNITY): Payer: Self-pay | Admitting: Urology

## 2024-02-07 ENCOUNTER — Encounter (HOSPITAL_COMMUNITY)
Admission: RE | Disposition: A | Discharge status: Home / Self Care | Payer: Self-pay | Source: Home / Self Care | Attending: Urology

## 2024-02-07 ENCOUNTER — Other Ambulatory Visit: Payer: Self-pay

## 2024-02-07 ENCOUNTER — Ambulatory Visit (HOSPITAL_COMMUNITY)
Admission: RE | Admit: 2024-02-07 | Discharge: 2024-02-07 | Disposition: A | Discharge status: Home / Self Care | Attending: Urology | Admitting: Urology

## 2024-02-07 ENCOUNTER — Ambulatory Visit (HOSPITAL_COMMUNITY): Payer: Self-pay | Admitting: Physician Assistant

## 2024-02-07 ENCOUNTER — Ambulatory Visit (HOSPITAL_BASED_OUTPATIENT_CLINIC_OR_DEPARTMENT_OTHER): Payer: Self-pay | Admitting: Physician Assistant

## 2024-02-07 DIAGNOSIS — I739 Peripheral vascular disease, unspecified: Secondary | ICD-10-CM | POA: Diagnosis not present

## 2024-02-07 DIAGNOSIS — Z01818 Encounter for other preprocedural examination: Secondary | ICD-10-CM

## 2024-02-07 DIAGNOSIS — I10 Essential (primary) hypertension: Secondary | ICD-10-CM

## 2024-02-07 DIAGNOSIS — N201 Calculus of ureter: Secondary | ICD-10-CM

## 2024-02-07 DIAGNOSIS — I714 Abdominal aortic aneurysm, without rupture, unspecified: Secondary | ICD-10-CM | POA: Insufficient documentation

## 2024-02-07 DIAGNOSIS — Z87891 Personal history of nicotine dependence: Secondary | ICD-10-CM | POA: Diagnosis not present

## 2024-02-07 DIAGNOSIS — N132 Hydronephrosis with renal and ureteral calculous obstruction: Secondary | ICD-10-CM | POA: Diagnosis not present

## 2024-02-07 DIAGNOSIS — I2510 Atherosclerotic heart disease of native coronary artery without angina pectoris: Secondary | ICD-10-CM | POA: Diagnosis not present

## 2024-02-07 SURGERY — CYSTOSCOPY/URETEROSCOPY/HOLMIUM LASER/STENT PLACEMENT
Anesthesia: General | Laterality: Right

## 2024-02-07 MED ORDER — ONDANSETRON HCL 4 MG/2ML IJ SOLN: 4.0000 mg | Freq: Once | INTRAMUSCULAR | Status: DC | PRN

## 2024-02-07 MED ORDER — ONDANSETRON HCL 4 MG/2ML IJ SOLN
INTRAMUSCULAR | Status: AC
Start: 2024-02-07 — End: 2024-02-07
  Filled 2024-02-07: qty 2

## 2024-02-07 MED ORDER — PROPOFOL 10 MG/ML IV BOLUS
INTRAVENOUS | Status: AC
Start: 2024-02-07 — End: 2024-02-07
  Filled 2024-02-07: qty 20

## 2024-02-07 MED ORDER — PHENYLEPHRINE 80 MCG/ML (10ML) SYRINGE FOR IV PUSH (FOR BLOOD PRESSURE SUPPORT)
PREFILLED_SYRINGE | INTRAVENOUS | Status: AC
  Filled 2024-02-07: qty 10

## 2024-02-07 MED ORDER — AMISULPRIDE (ANTIEMETIC) 5 MG/2ML IV SOLN: 10.0000 mg | Freq: Once | INTRAVENOUS | Status: DC | PRN

## 2024-02-07 MED ORDER — EPHEDRINE 5 MG/ML INJ
INTRAVENOUS | Status: AC
  Filled 2024-02-07: qty 5

## 2024-02-07 MED ORDER — DEXAMETHASONE SODIUM PHOSPHATE 10 MG/ML IJ SOLN
INTRAMUSCULAR | Status: AC
  Filled 2024-02-07: qty 1

## 2024-02-07 MED ORDER — ORAL CARE MOUTH RINSE: 15.0000 mL | Freq: Once | OROMUCOSAL | Status: AC

## 2024-02-07 MED ORDER — EPHEDRINE 5 MG/ML INJ
INTRAVENOUS | Status: AC
Start: 2024-02-07 — End: 2024-02-07
  Filled 2024-02-07: qty 5

## 2024-02-07 MED ORDER — IOHEXOL 300 MG/ML  SOLN: INTRAMUSCULAR | Status: DC | PRN

## 2024-02-07 MED ORDER — ONDANSETRON HCL 4 MG/2ML IJ SOLN
INTRAMUSCULAR | Status: DC | PRN
  Administered 2024-02-07: 4 mg via INTRAVENOUS

## 2024-02-07 MED ORDER — CEFAZOLIN SODIUM-DEXTROSE 2-4 GM/100ML-% IV SOLN
2.0000 g | INTRAVENOUS | Status: AC
Start: 2024-02-07 — End: 2024-02-07
  Administered 2024-02-07: 2 g via INTRAVENOUS
  Filled 2024-02-07: qty 100

## 2024-02-07 MED ORDER — PROPOFOL 10 MG/ML IV BOLUS
INTRAVENOUS | Status: DC | PRN
  Administered 2024-02-07: 150 mg via INTRAVENOUS

## 2024-02-07 MED ORDER — FENTANYL CITRATE (PF) 100 MCG/2ML IJ SOLN
INTRAMUSCULAR | Status: AC
  Filled 2024-02-07: qty 2

## 2024-02-07 MED ORDER — ACETAMINOPHEN 10 MG/ML IV SOLN
1000.0000 mg | Freq: Once | INTRAVENOUS | Status: DC | PRN
  Administered 2024-02-07: 1000 mg via INTRAVENOUS

## 2024-02-07 MED ORDER — EPHEDRINE SULFATE-NACL 50-0.9 MG/10ML-% IV SOSY
PREFILLED_SYRINGE | INTRAVENOUS | Status: DC | PRN
  Administered 2024-02-07: 5 mg via INTRAVENOUS
  Administered 2024-02-07 (×3): 10 mg via INTRAVENOUS

## 2024-02-07 MED ORDER — LIDOCAINE HCL (CARDIAC) PF 100 MG/5ML IV SOSY
PREFILLED_SYRINGE | INTRAVENOUS | Status: DC | PRN
  Administered 2024-02-07: 60 mg via INTRAVENOUS

## 2024-02-07 MED ORDER — ACETAMINOPHEN 10 MG/ML IV SOLN
INTRAVENOUS | Status: AC
Start: 2024-02-07 — End: 2024-02-07
  Filled 2024-02-07: qty 100

## 2024-02-07 MED ORDER — DEXAMETHASONE SODIUM PHOSPHATE 10 MG/ML IJ SOLN
INTRAMUSCULAR | Status: DC | PRN
  Administered 2024-02-07: 4 mg via INTRAVENOUS

## 2024-02-07 MED ORDER — SODIUM CHLORIDE 0.9 % IR SOLN
Status: DC | PRN
  Administered 2024-02-07: 3000 mL via INTRAVESICAL

## 2024-02-07 MED ORDER — LACTATED RINGERS IV SOLN: INTRAVENOUS | Status: DC

## 2024-02-07 MED ORDER — FENTANYL CITRATE PF 50 MCG/ML IJ SOSY: 25.0000 ug | PREFILLED_SYRINGE | INTRAMUSCULAR | Status: DC | PRN

## 2024-02-07 MED ORDER — CHLORHEXIDINE GLUCONATE 0.12 % MT SOLN
15.0000 mL | Freq: Once | OROMUCOSAL | Status: AC
  Administered 2024-02-07: 15 mL via OROMUCOSAL

## 2024-02-07 MED ORDER — FENTANYL CITRATE (PF) 100 MCG/2ML IJ SOLN
INTRAMUSCULAR | Status: DC | PRN
  Administered 2024-02-07: 25 ug via INTRAVENOUS
  Administered 2024-02-07: 50 ug via INTRAVENOUS
  Administered 2024-02-07: 25 ug via INTRAVENOUS

## 2024-02-07 MED ORDER — PHENYLEPHRINE 80 MCG/ML (10ML) SYRINGE FOR IV PUSH (FOR BLOOD PRESSURE SUPPORT)
PREFILLED_SYRINGE | INTRAVENOUS | Status: DC | PRN
  Administered 2024-02-07 (×3): 80 ug via INTRAVENOUS

## 2024-02-07 SURGICAL SUPPLY — 19 items
BAG URO CATCHER STRL LF (MISCELLANEOUS) ×1 IMPLANT
BASKET ZERO TIP 1.9FR (BASKET) IMPLANT
BASKET ZERO TIP NITINOL 2.4FR (BASKET) IMPLANT
CATH URETL OPEN 5X70 (CATHETERS) ×1 IMPLANT
CLOTH BEACON ORANGE TIMEOUT ST (SAFETY) ×1 IMPLANT
DRSG TEGADERM 2-3/8X2-3/4 SM (GAUZE/BANDAGES/DRESSINGS) IMPLANT
FIBER LASER MOSES 200 DFL (Laser) IMPLANT
GLOVE BIO SURGEON STRL SZ 6.5 (GLOVE) ×1 IMPLANT
GOWN STRL REUS W/ TWL LRG LVL3 (GOWN DISPOSABLE) ×1 IMPLANT
GUIDEWIRE STR DUAL SENSOR (WIRE) ×1 IMPLANT
KIT TURNOVER KIT A (KITS) ×1 IMPLANT
MANIFOLD NEPTUNE II (INSTRUMENTS) ×1 IMPLANT
PACK CYSTO (CUSTOM PROCEDURE TRAY) ×1 IMPLANT
SHEATH NAVIGATOR HD 11/13X28 (SHEATH) IMPLANT
SHEATH NAVIGATOR HD 11/13X36 (SHEATH) IMPLANT
STENT URET 6FRX24 CONTOUR (STENTS) IMPLANT
TRACTIP FLEXIVA PULS ID 200XHI (Laser) IMPLANT
TUBING CONNECTING 10 (TUBING) ×1 IMPLANT
TUBING UROLOGY SET (TUBING) ×1 IMPLANT

## 2024-02-07 NOTE — Anesthesia Procedure Notes (Addendum)
 Procedure Name: LMA Insertion Date/Time: 02/07/2024 2:30 PM  Performed by: Metta Andrea NOVAK, CRNAPre-anesthesia Checklist: Patient identified, Emergency Drugs available, Suction available, Patient being monitored and Timeout performed Patient Re-evaluated:Patient Re-evaluated prior to induction Oxygen Delivery Method: Circle system utilized Preoxygenation: Pre-oxygenation with 100% oxygen Induction Type: IV induction Ventilation: Mask ventilation without difficulty LMA: LMA inserted and LMA with gastric port inserted LMA Size: 4.0 Number of attempts: 1 Tube secured with: Tape Dental Injury: Teeth and Oropharynx as per pre-operative assessment

## 2024-02-07 NOTE — Discharge Instructions (Signed)
 DISCHARGE INSTRUCTIONS FOR KIDNEY STONE/URETERAL STENT   MEDICATIONS:  1. Resume all your other meds from home  2. AZO over the counter can help with the burning/stinging when you urinate. 3. Tramadol  is for moderate/severe pain, otherwise taking up to 1000 mg every 6 hours of plain Tylenol  will help treat your pain.   4. Take Cephalexin one hour prior to removal of your stent.    ACTIVITY:  1. No strenuous activity x 1week  2. No driving while on narcotic pain medications  3. Drink plenty of water  4. Continue to walk at home - you can still get blood clots when you are at home, so keep active, but don't over do it.  5. May return to work/school tomorrow or when you feel ready   BATHING:  1. You can shower and we recommend daily showers  2. You have a string coming from your urethra: The stent string is attached to your ureteral stent. Do not pull on this.   SIGNS/SYMPTOMS TO CALL:  Please call us  if you have a fever greater than 101.5, uncontrolled nausea/vomiting, uncontrolled pain, dizziness, unable to urinate, bloody urine, chest pain, shortness of breath, leg swelling, leg pain, redness around wound, drainage from wound, or any other concerns or questions.   You can reach us  at [phone].   FOLLOW-UP:  1. You have a string attached to your stent, you may remove it on Friday, September 23rd. To do this, pull the string until the stent is completely removed. You may feel an odd sensation in your back.

## 2024-02-07 NOTE — Interval H&P Note (Signed)
 History and Physical Interval Note: Here for staged procedure  02/07/2024 2:13 PM  William Warner  has presented today for surgery, with the diagnosis of RIGHT URETERAL CALCULUS.  The various methods of treatment have been discussed with the patient and family. After consideration of risks, benefits and other options for treatment, the patient has consented to  Procedure(s) with comments: CYSTOSCOPY/URETEROSCOPY/HOLMIUM LASER/STENT PLACEMENT (Right) - CYSTOSCOPY/RIGHT URETEROSCOPY/HOLMIUM LASER/STENT EXCHANGE as a surgical intervention.  The patient's history has been reviewed, patient examined, no change in status, stable for surgery.  I have reviewed the patient's chart and labs.  Questions were answered to the patient's satisfaction.     Jeriah Corkum D Angela Platner

## 2024-02-07 NOTE — Anesthesia Preprocedure Evaluation (Addendum)
 Anesthesia Evaluation  Patient identified by MRN, date of birth, ID band Patient awake    Reviewed: Allergy & Precautions, NPO status , Patient's Chart, lab work & pertinent test results  Airway Mallampati: II  TM Distance: >3 FB Neck ROM: Full    Dental no notable dental hx.    Pulmonary former smoker   Pulmonary exam normal        Cardiovascular hypertension, Pt. on home beta blockers + CAD and + Peripheral Vascular Disease  Normal cardiovascular exam     Neuro/Psych negative neurological ROS  negative psych ROS   GI/Hepatic negative GI ROS, Neg liver ROS,,,  Endo/Other  negative endocrine ROS    Renal/GU negative Renal ROS     Musculoskeletal  (+) Arthritis ,    Abdominal   Peds  Hematology negative hematology ROS (+)   Anesthesia Other Findings RIGHT URETERAL CALCULUS  Reproductive/Obstetrics                              Anesthesia Physical Anesthesia Plan  ASA: 3  Anesthesia Plan: General   Post-op Pain Management:    Induction: Intravenous  PONV Risk Score and Plan: 2 and Ondansetron , Dexamethasone  and Treatment may vary due to age or medical condition  Airway Management Planned: LMA  Additional Equipment:   Intra-op Plan:   Post-operative Plan: Extubation in OR  Informed Consent: I have reviewed the patients History and Physical, chart, labs and discussed the procedure including the risks, benefits and alternatives for the proposed anesthesia with the patient or authorized representative who has indicated his/her understanding and acceptance.     Dental advisory given  Plan Discussed with: CRNA  Anesthesia Plan Comments:          Anesthesia Quick Evaluation

## 2024-02-07 NOTE — Transfer of Care (Signed)
 Immediate Anesthesia Transfer of Care Note  Patient: William Warner  Procedure(s) Performed: CYSTOSCOPY/URETEROSCOPY/STENT PLACEMENT (Right)  Patient Location: PACU  Anesthesia Type:General  Level of Consciousness: awake  Airway & Oxygen Therapy: Patient Spontanous Breathing  Post-op Assessment: Report given to RN and Post -op Vital signs reviewed and stable  Post vital signs: Reviewed and stable  Last Vitals:  Vitals Value Taken Time  BP 141/69 02/07/24 15:13  Temp    Pulse 84 02/07/24 15:14  Resp 14 02/07/24 15:14  SpO2 96 % 02/07/24 15:14  Vitals shown include unfiled device data.  Last Pain:  Vitals:   02/07/24 1300  TempSrc:   PainSc: 4       Patients Stated Pain Goal: 5 (02/07/24 1300)  Complications: No notable events documented.

## 2024-02-07 NOTE — Op Note (Signed)
 Preoperative diagnosis: right ureteral calculus  Postoperative diagnosis: right ureteral calculus  Procedure:  Cystoscopy right ureteroscopy with basket stone extraction right 59F x 24cm ureteral stent exchange - with tether  Surgeon: Valli Shank, MD  Anesthesia: General  Complications: None  Intraoperative findings:  Normal urethra Bilateral lobe hypertrophy prostatic urethra Bilateral orthotropic ureteral orifices Bladder mucosa normal without masses   EBL: Minimal  Specimens: Right renal calculi given to patient  Disposition of specimens: [facility] for stone analysis  Indication: William Warner is a 78 y.o.   patient with a 9mm right ureteral stone here for staged procedure. After reviewing the management options for treatment, the patient elected to proceed with the above surgical procedure(s). We have discussed the potential benefits and risks of the procedure, side effects of the proposed treatment, the likelihood of the patient achieving the goals of the procedure, and any potential problems that might occur during the procedure or recuperation. Informed consent has been obtained.   Description of procedure:  The patient was taken to the operating room and general anesthesia was induced.  The patient was placed in the dorsal lithotomy position, prepped and draped in the usual sterile fashion, and preoperative antibiotics were administered. A preoperative time-out was performed.   Cystourethroscopy was performed.  The patient's urethra was examined and demonstrated bilobar prostatic hypertrophy.  The bladder was then systematically examined in its entirety. There was no evidence for any bladder tumors, stones, or other mucosal pathology.    Attention then turned to the right ureteral orifice and graspers were used to bring the existing ureteral stent to the urethral meatus.  A 0.38 sensor was then advanced through the ureteral stent up to the renal  pelvis under fluoroscopic guidance.  The stent was discarded.  A second wire was advanced alongside the first sensor wire in similar manner.  1 wire was secured as a safety wire.  A ureteral access sheath was placed over the second wire and advanced the proximal ureter fluoroscopic guidance.  The inner wire and sheath were removed.  Flexible ureteroscopy then took place.  There were 2 small stone fragments in the upper pole which were basketed with a ZeroTip basket.  The ureteroscope was then removed and using with the access sheath taking care to examine the ureter on the way out.   Reinspection of the ureter revealed no remaining visible stones or fragments.   The wire was then backloaded through the cystoscope and a ureteral stent was advance over the wire using Seldinger technique.  The stent was positioned appropriately under fluoroscopic and cystoscopic guidance.  The wire was then removed with an adequate stent curl noted in the renal pelvis as well as in the bladder.  The bladder was then emptied and the procedure ended.  The patient appeared to tolerate the procedure well and without complications.  The patient was able to be awakened and transferred to the recovery unit in satisfactory condition.   Disposition: The tether of the stent was left on and secured to the ventral aspect of the patient's penis. Instructions for removing the stent have been provided to the patient.

## 2024-02-08 ENCOUNTER — Encounter (HOSPITAL_COMMUNITY): Payer: Self-pay | Admitting: Urology

## 2024-02-08 ENCOUNTER — Other Ambulatory Visit (HOSPITAL_COMMUNITY): Payer: Self-pay

## 2024-02-08 NOTE — Anesthesia Postprocedure Evaluation (Signed)
 Anesthesia Post Note  Patient: William Warner  Procedure(s) Performed: CYSTOSCOPY/URETEROSCOPY/STENT PLACEMENT (Right)     Patient location during evaluation: PACU Anesthesia Type: General Level of consciousness: awake Pain management: pain level controlled Vital Signs Assessment: post-procedure vital signs reviewed and stable Respiratory status: spontaneous breathing, nonlabored ventilation and respiratory function stable Cardiovascular status: blood pressure returned to baseline and stable Postop Assessment: no apparent nausea or vomiting Anesthetic complications: no   No notable events documented.  Last Vitals:  Vitals:   02/07/24 1557 02/07/24 1617  BP: (!) 144/54 136/64  Pulse: 71 73  Resp: 18 18  Temp:  36.6 C  SpO2: 97% 92%    Last Pain:  Vitals:   02/07/24 1617  TempSrc:   PainSc: 0-No pain                 Tempest Frankland P Skila Rollins

## 2024-02-14 DIAGNOSIS — R350 Frequency of micturition: Secondary | ICD-10-CM | POA: Diagnosis not present

## 2024-02-14 DIAGNOSIS — N200 Calculus of kidney: Secondary | ICD-10-CM | POA: Diagnosis not present

## 2024-02-14 DIAGNOSIS — N201 Calculus of ureter: Secondary | ICD-10-CM | POA: Diagnosis not present

## 2024-02-22 ENCOUNTER — Other Ambulatory Visit: Payer: Self-pay

## 2024-02-22 ENCOUNTER — Other Ambulatory Visit (HOSPITAL_COMMUNITY): Payer: Self-pay

## 2024-02-22 MED ORDER — ROSUVASTATIN CALCIUM 20 MG PO TABS
20.0000 mg | ORAL_TABLET | Freq: Every day | ORAL | 3 refills | Status: AC
  Filled 2024-02-22: qty 90, 90d supply, fill #0
  Filled 2024-05-17: qty 90, 90d supply, fill #1

## 2024-02-22 MED ORDER — GEMFIBROZIL 600 MG PO TABS
600.0000 mg | ORAL_TABLET | Freq: Every evening | ORAL | 3 refills | Status: AC
  Filled 2024-02-22: qty 90, 90d supply, fill #0
  Filled 2024-05-17: qty 90, 90d supply, fill #1

## 2024-02-22 MED ORDER — TAMSULOSIN HCL 0.4 MG PO CAPS
0.4000 mg | ORAL_CAPSULE | Freq: Every evening | ORAL | 3 refills | Status: AC
  Filled 2024-02-22: qty 90, 90d supply, fill #0
  Filled 2024-05-17: qty 90, 90d supply, fill #1

## 2024-02-22 MED ORDER — ATENOLOL 25 MG PO TABS
25.0000 mg | ORAL_TABLET | Freq: Every evening | ORAL | 3 refills | Status: AC
  Filled 2024-02-22: qty 90, 90d supply, fill #0
  Filled 2024-05-17: qty 90, 90d supply, fill #1

## 2024-03-19 DIAGNOSIS — Z85828 Personal history of other malignant neoplasm of skin: Secondary | ICD-10-CM | POA: Diagnosis not present

## 2024-03-19 DIAGNOSIS — L821 Other seborrheic keratosis: Secondary | ICD-10-CM | POA: Diagnosis not present

## 2024-03-23 DIAGNOSIS — N202 Calculus of kidney with calculus of ureter: Secondary | ICD-10-CM | POA: Diagnosis not present

## 2024-03-30 ENCOUNTER — Encounter (HOSPITAL_BASED_OUTPATIENT_CLINIC_OR_DEPARTMENT_OTHER): Payer: Self-pay

## 2024-03-30 ENCOUNTER — Emergency Department (HOSPITAL_BASED_OUTPATIENT_CLINIC_OR_DEPARTMENT_OTHER)

## 2024-03-30 ENCOUNTER — Other Ambulatory Visit: Payer: Self-pay

## 2024-03-30 ENCOUNTER — Emergency Department (HOSPITAL_BASED_OUTPATIENT_CLINIC_OR_DEPARTMENT_OTHER)
Admission: EM | Admit: 2024-03-30 | Discharge: 2024-03-30 | Disposition: A | Discharge status: Home / Self Care | Attending: Emergency Medicine | Admitting: Emergency Medicine

## 2024-03-30 DIAGNOSIS — R10A1 Flank pain, right side: Secondary | ICD-10-CM | POA: Diagnosis present

## 2024-03-30 DIAGNOSIS — Z7982 Long term (current) use of aspirin: Secondary | ICD-10-CM | POA: Diagnosis not present

## 2024-03-30 DIAGNOSIS — N132 Hydronephrosis with renal and ureteral calculous obstruction: Secondary | ICD-10-CM | POA: Diagnosis not present

## 2024-03-30 DIAGNOSIS — Z87891 Personal history of nicotine dependence: Secondary | ICD-10-CM | POA: Insufficient documentation

## 2024-03-30 DIAGNOSIS — I7143 Infrarenal abdominal aortic aneurysm, without rupture: Secondary | ICD-10-CM | POA: Diagnosis not present

## 2024-03-30 DIAGNOSIS — N201 Calculus of ureter: Secondary | ICD-10-CM | POA: Insufficient documentation

## 2024-03-30 DIAGNOSIS — K5730 Diverticulosis of large intestine without perforation or abscess without bleeding: Secondary | ICD-10-CM | POA: Diagnosis not present

## 2024-03-30 LAB — CBC
HCT: 43.4 % (ref 39.0–52.0)
Hemoglobin: 14.4 g/dL (ref 13.0–17.0)
MCH: 31.4 pg (ref 26.0–34.0)
MCHC: 33.2 g/dL (ref 30.0–36.0)
MCV: 94.8 fL (ref 80.0–100.0)
Platelets: 168 K/uL (ref 150–400)
RBC: 4.58 MIL/uL (ref 4.22–5.81)
RDW: 12.4 % (ref 11.5–15.5)
WBC: 7.1 K/uL (ref 4.0–10.5)
nRBC: 0 % (ref 0.0–0.2)

## 2024-03-30 LAB — URINALYSIS, ROUTINE W REFLEX MICROSCOPIC
Bilirubin Urine: NEGATIVE
Glucose, UA: NEGATIVE mg/dL
Hgb urine dipstick: NEGATIVE
Ketones, ur: 15 mg/dL — AB
Leukocytes,Ua: NEGATIVE
Nitrite: NEGATIVE
Protein, ur: NEGATIVE mg/dL
Specific Gravity, Urine: 1.014 (ref 1.005–1.030)
pH: 5.5 (ref 5.0–8.0)

## 2024-03-30 LAB — BASIC METABOLIC PANEL WITH GFR
Anion gap: 13 (ref 5–15)
BUN: 11 mg/dL (ref 8–23)
CO2: 25 mmol/L (ref 22–32)
Calcium: 9.6 mg/dL (ref 8.9–10.3)
Chloride: 101 mmol/L (ref 98–111)
Creatinine, Ser: 0.82 mg/dL (ref 0.61–1.24)
GFR, Estimated: >60 mL/min (ref >60)
Glucose, Bld: 92 mg/dL (ref 70–99)
Potassium: 4 mmol/L (ref 3.5–5.1)
Sodium: 139 mmol/L (ref 135–145)

## 2024-03-30 MED ORDER — KETOROLAC TROMETHAMINE 15 MG/ML IJ SOLN
15.0000 mg | Freq: Once | INTRAMUSCULAR | Status: AC
  Administered 2024-03-30: 15 mg via INTRAVENOUS
  Filled 2024-03-30: qty 1

## 2024-03-30 MED ORDER — OXYCODONE HCL 5 MG PO TABS: 5.0000 mg | ORAL_TABLET | ORAL | 0 refills | Status: AC | PRN

## 2024-03-30 MED ORDER — OXYCODONE HCL 5 MG PO TABS
5.0000 mg | ORAL_TABLET | Freq: Once | ORAL | Status: AC
  Administered 2024-03-30: 5 mg via ORAL
  Filled 2024-03-30: qty 1

## 2024-03-30 NOTE — ED Triage Notes (Signed)
 Patient reports 5 days of lower right back pain. Has hx of kidney stone and has had surgeries for it. Says it feels like a different pain though. Had US  on Thursday of kidney and bladder and said that there was no damage done. After that appointment the pain began.

## 2024-03-30 NOTE — Discharge Instructions (Addendum)
 Contact your urologist to schedule follow-up next week in regard to your recurrent kidney stones.  Continue Flomax  as previously directed.  Continue Tylenol  as needed for pain.  Start oxycodone , 1 tablet every 4 hours as needed for severe pain.  Please be aware that this medication may cause fatigue/drowsiness, do not take this medication prior to operating heavy machinery.  Return to the emergency department if your symptoms worsen.

## 2024-03-30 NOTE — ED Provider Notes (Signed)
 [facility] AT [facility] Provider Note   CSN: [id number] Arrival date & time: 03/30/24  1338     Patient presents with: Back Pain and Flank Pain   William Warner is a 78 y.o. male.   78 year old male presenting with right sided flank/abdominal pain.  Patient notes that symptoms began Sunday or Monday and have progressively worsened according to his wife, he notes that this does not feel similar to kidney stones that he has had previously, describes pain as sudden/sharp/stabbing and worse with movement.  He denies radiation of the pain down his legs, reports that pain starts in the right side of his back and radiates around to the right side of his abdomen.  He denies dysuria or hematuria, no testicular pain.  He has an extensive history of kidney stones, most recently he had ureteral stents placed on 9/2 and 9/23, he had an ultrasound of his kidneys and bladder last week and was told that these were normal.  He denies fever, nausea/vomiting/diarrhea, chest pain, shortness of breath.   Back Pain Flank Pain       Prior to Admission medications   Medication Sig Start Date End Date Taking? Authorizing Provider  acetaminophen  (TYLENOL ) 500 MG tablet Take 500 mg by mouth every 6 (six) hours as needed.    [provider]  aspirin 81 MG tablet Take 81 mg by mouth daily.    [provider]  atenolol  (TENORMIN ) 25 MG tablet Take 1 tablet (25 mg total) by mouth every evening for blood pressure 02/22/24     Cholecalciferol 50 MCG (2000 UT) TBDP Take 2,000 Units by mouth daily.    [provider]  clonazePAM  (KLONOPIN ) 2 MG tablet Take 1 tablet (2 mg total) by mouth daily as needed. 07/08/23     cyanocobalamin (VITAMIN B12) 500 MCG tablet Take 500 mcg by mouth daily.    [provider]  EPINEPHrine (EPIPEN JR) 0.15 MG/0.3ML injection Inject 0.15 mg into the muscle as needed.    [provider]  gemfibrozil  (LOPID )  600 MG tablet Take 1 tablet (600 mg total) by mouth every evening for triglycerides 02/22/24     hyoscyamine  (LEVSIN  SL) 0.125 MG SL tablet Place 1 tablet (0.125 mg total) under the tongue every 6 (six) hours as needed. 01/17/24   Pace, Maryellen D, MD  ibuprofen  (ADVIL ) 600 MG tablet Take 1 tablet (600 mg total) by mouth every 6 (six) hours as needed. 08/26/22   Logan Ubaldo NOVAK, PA-C  Multiple Vitamin tablet Take 1 tablet by mouth daily.    [provider]  nitroGLYCERIN  (NITROSTAT ) 0.4 MG SL tablet DISSOLVE 1 TABLET UNDER THE TONGUE EVERY 5 MINUTES AS NEEDED FOR CHEST PAIN. DO NOT EXCEED A TOTAL OF 3 DOSES IN 15 MINUTES. 06/24/21   Patwardhan, Manish J, MD  rosuvastatin  (CRESTOR ) 20 MG tablet Take 1 tablet (20 mg total) by mouth daily. 02/22/24     tamsulosin  (FLOMAX ) 0.4 MG CAPS capsule Take 1 capsule (0.4 mg total) by mouth every evening. 02/22/24     traMADol  (ULTRAM ) 50 MG tablet Take 1 tablet (50 mg total) by mouth every 6 (six) hours as needed. 01/17/24 01/16/25  Pace, Maryellen D, MD    Allergies: Bee venom and Augmentin [amoxicillin-pot clavulanate]    Review of Systems  Genitourinary:  Positive for flank pain.  Musculoskeletal:  Positive for back pain.    Updated Vital Signs BP 135/65   Pulse 60   Temp 98.1 F (  36.7 C)   Resp 16   SpO2 99%   Physical Exam Vitals and nursing note reviewed.  Constitutional:      General: He is in acute distress.  HENT:     Head: Normocephalic.  Eyes:     Extraocular Movements: Extraocular movements intact.  Cardiovascular:     Rate and Rhythm: Normal rate.  Pulmonary:     Effort: Pulmonary effort is normal.  Abdominal:     Palpations: Abdomen is soft.     Tenderness: There is abdominal tenderness (RLQ/flank). There is no right CVA tenderness, left CVA tenderness or guarding.  Musculoskeletal:     Cervical back: Normal range of motion.     Comments: Moves all extremities spontaneously without difficulty  Skin:    General: Skin is  warm and dry.  Neurological:     Mental Status: He is alert and oriented to person, place, and time.     (all labs ordered are listed, but only abnormal results are displayed) Labs Reviewed  URINALYSIS, ROUTINE W REFLEX MICROSCOPIC - Abnormal; Notable for the following components:      Result Value   Ketones, ur 15 (*)    All other components within normal limits  BASIC METABOLIC PANEL WITH GFR  CBC    EKG: None  Radiology: No results found.   Procedures   Medications Ordered in the ED - No data to display                                  Medical Decision Making This patient presents to the ED for concern of flank/abdominal pain, this involves an extensive number of treatment options, and is a complaint that carries with it a high risk of complications and morbidity.  The differential diagnosis includes nephrolithiasis, ureterolithiasis, urinary tract infection, appendicitis, SBO   Co morbidities that complicate the patient evaluation  Frequent recurrent kidney stones   Additional history obtained:  Additional history obtained from record review External records from outside source obtained and reviewed including previous urology procedure notes   Lab Tests:  I Ordered, and personally interpreted labs.  The pertinent results include: CBC and BMP within normal limits.  Urinalysis notable for ketones but otherwise unremarkable.   Imaging Studies ordered:  I ordered imaging studies including CT renal stone study  I independently visualized and interpreted imaging which showed 1. Mild right pelvocaliectasis and slight prominence of the ureter secondary to a 4 mm stone within the proximal to mid right ureter at the L5 level. 2. Nonobstructing left kidney stone. 3. 3.7 cm infrarenal abdominal aortic aneurysm. Recommend follow-up ultrasound every 3 years. (Ref.: J Vasc Surg. 2018; 67:2-77 and J Am Coll Radiol 2013;10(10):789-794.) 4. Diverticular disease of the left  colon without acute inflammatory process. 5. Aortic atherosclerosis.  I agree with the radiologist interpretation   Cardiac Monitoring: / EKG:  The patient was maintained on a cardiac monitor.  I personally viewed and interpreted the cardiac monitored which showed an underlying rhythm of: NSR   Problem List / ED Course / Critical interventions / Medication management  I ordered medication including Toradol and oxycodone  for pain Reevaluation of the patient after these medicines showed that the patient improved I have reviewed the patients home medicines and have made adjustments as needed   Social Determinants of Health:  Former tobacco use   Test / Admission - Considered:  Physical exam notable as above, abdomen is  notable for some mild tenderness to palpation in the right quadrant/flank, no CVA tenderness, no guarding/rigidity, vitals are reassuring as patient is afebrile and without tachycardia.  Labs are generally unremarkable as above, no leukocytosis, urine is without overt signs of infection.   Patient does have extensive history of nephrolithiasis/ureterolithiasis, had 2 stents placed in September, is followed closely by [facility].  Patient takes Flomax  daily. CT imaging is notable as above, I suspect that his symptoms today are secondary to 4mm stone as noted in the proximal/mid R ureter.  I discussed these findings in depth with the patient, I recommend that he continue Flomax  as previously directed, will prescribe short course of oxycodone  to be used as needed for breakthrough pain.  I recommend that he contact his provider at [facility] urology on Monday to schedule follow-up to ensure that his stone has passed.  He voiced understanding and is in agreement with this plan, return precautions discussed, he is appropriate for discharge at this time.  Amount and/or Complexity of Data Reviewed Labs: ordered. Radiology: ordered.  Risk Prescription drug  management.        Final diagnoses:  Ureterolithiasis    ED Discharge Orders     None          Glendia Rocky SAILOR, [place] 03/30/24 2015    Kingsley, Victoria K, [place] 03/30/24 2321

## 2024-04-20 ENCOUNTER — Other Ambulatory Visit (HOSPITAL_COMMUNITY): Payer: Self-pay

## 2024-04-24 DIAGNOSIS — H2513 Age-related nuclear cataract, bilateral: Secondary | ICD-10-CM | POA: Diagnosis not present

## 2024-04-24 DIAGNOSIS — H5203 Hypermetropia, bilateral: Secondary | ICD-10-CM | POA: Diagnosis not present

## 2024-05-18 ENCOUNTER — Other Ambulatory Visit: Payer: Self-pay

## 2024-05-18 ENCOUNTER — Other Ambulatory Visit (HOSPITAL_COMMUNITY): Payer: Self-pay

## 2024-09-05 ENCOUNTER — Ambulatory Visit: Admitting: Cardiology
